# Patient Record
Sex: Male | Born: 1950 | Race: White | Hispanic: Yes | Marital: Married | Smoking: Current some day smoker
Health system: Southern US, Community
[De-identification: ages and names within clinical notes are randomized; demographics above are authoritative.]

## PROBLEM LIST (undated history)

## (undated) DIAGNOSIS — K219 Gastro-esophageal reflux disease without esophagitis: Secondary | ICD-10-CM

## (undated) DIAGNOSIS — I10 Essential (primary) hypertension: Secondary | ICD-10-CM

## (undated) DIAGNOSIS — H8110 Benign paroxysmal vertigo, unspecified ear: Secondary | ICD-10-CM

## (undated) DIAGNOSIS — E785 Hyperlipidemia, unspecified: Secondary | ICD-10-CM

## (undated) DIAGNOSIS — T7840XA Allergy, unspecified, initial encounter: Secondary | ICD-10-CM

## (undated) DIAGNOSIS — E119 Type 2 diabetes mellitus without complications: Secondary | ICD-10-CM

## (undated) HISTORY — PX: HERNIA REPAIR: SHX51

## (undated) HISTORY — DX: Allergy, unspecified, initial encounter: T78.40XA

## (undated) HISTORY — DX: Essential (primary) hypertension: I10

## (undated) HISTORY — PX: COLONOSCOPY: SHX174

## (undated) HISTORY — DX: Benign paroxysmal vertigo, unspecified ear: H81.10

## (undated) HISTORY — DX: Hyperlipidemia, unspecified: E78.5

## (undated) HISTORY — DX: Type 2 diabetes mellitus without complications: E11.9

## (undated) HISTORY — DX: Gastro-esophageal reflux disease without esophagitis: K21.9

---

## 2011-11-14 ENCOUNTER — Ambulatory Visit (HOSPITAL_BASED_OUTPATIENT_CLINIC_OR_DEPARTMENT_OTHER): Payer: Medicare Other

## 2011-11-29 ENCOUNTER — Ambulatory Visit (HOSPITAL_COMMUNITY)
Admission: RE | Admit: 2011-11-29 | Discharge: 2011-11-29 | Disposition: A | Discharge status: Home / Self Care | Payer: Medicare Other | Source: Ambulatory Visit | Attending: Internal Medicine | Admitting: Internal Medicine

## 2011-11-29 ENCOUNTER — Other Ambulatory Visit (HOSPITAL_COMMUNITY): Payer: Self-pay | Admitting: Internal Medicine

## 2011-11-29 DIAGNOSIS — R0602 Shortness of breath: Secondary | ICD-10-CM | POA: Insufficient documentation

## 2011-11-29 DIAGNOSIS — R079 Chest pain, unspecified: Secondary | ICD-10-CM | POA: Insufficient documentation

## 2012-10-24 DIAGNOSIS — K219 Gastro-esophageal reflux disease without esophagitis: Secondary | ICD-10-CM | POA: Insufficient documentation

## 2013-01-22 DIAGNOSIS — N529 Male erectile dysfunction, unspecified: Secondary | ICD-10-CM | POA: Insufficient documentation

## 2014-11-03 ENCOUNTER — Encounter (HOSPITAL_COMMUNITY): Payer: Self-pay | Admitting: *Deleted

## 2014-11-03 ENCOUNTER — Emergency Department (HOSPITAL_COMMUNITY): Payer: Medicare Other

## 2014-11-03 ENCOUNTER — Emergency Department (HOSPITAL_COMMUNITY)
Admission: EM | Admit: 2014-11-03 | Discharge: 2014-11-03 | Disposition: A | Discharge status: Home / Self Care | Payer: Medicare Other | Attending: Emergency Medicine | Admitting: Emergency Medicine

## 2014-11-03 DIAGNOSIS — J029 Acute pharyngitis, unspecified: Secondary | ICD-10-CM

## 2014-11-03 DIAGNOSIS — Z79899 Other long term (current) drug therapy: Secondary | ICD-10-CM | POA: Insufficient documentation

## 2014-11-03 DIAGNOSIS — Z791 Long term (current) use of non-steroidal anti-inflammatories (NSAID): Secondary | ICD-10-CM | POA: Diagnosis not present

## 2014-11-03 DIAGNOSIS — Z720 Tobacco use: Secondary | ICD-10-CM | POA: Diagnosis not present

## 2014-11-03 DIAGNOSIS — R6889 Other general symptoms and signs: Secondary | ICD-10-CM

## 2014-11-03 LAB — CBC WITH DIFFERENTIAL/PLATELET
BASOS ABS: 0 10*3/uL (ref 0.0–0.1)
BASOS PCT: 0 % (ref 0–1)
EOS ABS: 0 10*3/uL (ref 0.0–0.7)
Eosinophils Relative: 0 % (ref 0–5)
HEMATOCRIT: 40.4 % (ref 39.0–52.0)
Hemoglobin: 14.2 g/dL (ref 13.0–17.0)
Lymphocytes Relative: 11 % — ABNORMAL LOW (ref 12–46)
Lymphs Abs: 1.5 10*3/uL (ref 0.7–4.0)
MCH: 31.3 pg (ref 26.0–34.0)
MCHC: 35.1 g/dL (ref 30.0–36.0)
MCV: 89 fL (ref 78.0–100.0)
MONO ABS: 1.8 10*3/uL — AB (ref 0.1–1.0)
Monocytes Relative: 13 % — ABNORMAL HIGH (ref 3–12)
Neutro Abs: 10.4 10*3/uL — ABNORMAL HIGH (ref 1.7–7.7)
Neutrophils Relative %: 76 % (ref 43–77)
PLATELETS: 184 10*3/uL (ref 150–400)
RBC: 4.54 MIL/uL (ref 4.22–5.81)
RDW: 13.5 % (ref 11.5–15.5)
WBC: 13.7 10*3/uL — AB (ref 4.0–10.5)

## 2014-11-03 LAB — I-STAT CHEM 8, ED
BUN: 10 mg/dL (ref 6–23)
Calcium, Ion: 1.17 mmol/L (ref 1.13–1.30)
Chloride: 100 mmol/L (ref 96–112)
Creatinine, Ser: 0.7 mg/dL (ref 0.50–1.35)
GLUCOSE: 143 mg/dL — AB (ref 70–99)
HCT: 45 % (ref 39.0–52.0)
HEMOGLOBIN: 15.3 g/dL (ref 13.0–17.0)
POTASSIUM: 4 mmol/L (ref 3.5–5.1)
SODIUM: 136 mmol/L (ref 135–145)
TCO2: 23 mmol/L (ref 0–100)

## 2014-11-03 LAB — RAPID STREP SCREEN (MED CTR MEBANE ONLY): STREPTOCOCCUS, GROUP A SCREEN (DIRECT): NEGATIVE

## 2014-11-03 MED ORDER — ACETAMINOPHEN 500 MG PO TABS
1000.0000 mg | ORAL_TABLET | Freq: Once | ORAL | Status: AC
  Administered 2014-11-03: 1000 mg via ORAL
  Filled 2014-11-03: qty 2

## 2014-11-03 MED ORDER — ALBUTEROL SULFATE HFA 108 (90 BASE) MCG/ACT IN AERS: 2.0000 | INHALATION_SPRAY | RESPIRATORY_TRACT | Status: DC | PRN

## 2014-11-03 MED ORDER — ALBUTEROL SULFATE HFA 108 (90 BASE) MCG/ACT IN AERS
2.0000 | INHALATION_SPRAY | Freq: Once | RESPIRATORY_TRACT | Status: AC
  Administered 2014-11-03: 2 via RESPIRATORY_TRACT
  Filled 2014-11-03: qty 6.7

## 2014-11-03 MED ORDER — HYDROCODONE-HOMATROPINE 5-1.5 MG/5ML PO SYRP: 5.0000 mL | ORAL_SOLUTION | Freq: Four times a day (QID) | ORAL | Status: DC | PRN

## 2014-11-03 MED ORDER — IBUPROFEN 600 MG PO TABS: 600.0000 mg | ORAL_TABLET | Freq: Four times a day (QID) | ORAL | Status: DC | PRN

## 2014-11-03 NOTE — ED Provider Notes (Signed)
CSN: [id number]     Arrival date & time 11/03/14  8295 History   First MD Initiated Contact with Patient 11/03/14 [phone]     Chief Complaint  Patient presents with  . Sore Throat  . Generalized Body Aches     (Consider location/radiation/quality/duration/timing/severity/associated sxs/prior Treatment) HPI Patrick Park is a 64 y.o. male presents to emergency department complaining of sore throat, generalized body aches, cough for the last 3 days. Patient states he has had subjective fever and chills. He denies any nasal congestion. He has been taking naproxen, last dose taken yesterday. He denies any chest pain or shortness of breath. Denies any urinary symptoms. No nausea, vomiting, diarrhea. No recent sick contacts. States all his vaccines including flu vaccination is up-to-date. Has not tried any other treatment other than naproxen. Denies difficulty swallowing, change in voice, neck pain or stiffness. Nothing making his symptoms better or worse.  History reviewed. No pertinent past medical history. Past Surgical History  Procedure Laterality Date  . Hernia repair     No family history on file. History  Substance Use Topics  . Smoking status: Current Some Day Smoker  . Smokeless tobacco: Not on file  . Alcohol Use: No    Review of Systems  Constitutional: Positive for fever and chills.  HENT: Positive for sore throat. Negative for congestion, trouble swallowing and voice change.   Respiratory: Positive for cough. Negative for chest tightness and shortness of breath.   Cardiovascular: Negative for chest pain, palpitations and leg swelling.  Gastrointestinal: Negative for nausea, vomiting, abdominal pain, diarrhea and abdominal distention.  Genitourinary: Negative for dysuria, urgency, frequency and hematuria.  Musculoskeletal: Negative for myalgias, arthralgias, neck pain and neck stiffness.  Skin: Negative for rash.  Allergic/Immunologic: Negative for immunocompromised state.   Neurological: Negative for dizziness, weakness, light-headedness, numbness and headaches.  All other systems reviewed and are negative.     Allergies  Review of patient's allergies indicates not on file.  Home Medications   Prior to Admission medications   Medication Sig Start Date End Date Taking? Authorizing Provider  Fish Oil OIL Take 1 capsule by mouth daily.   Yes Historical Provider, MD  ibuprofen (ADVIL,MOTRIN) 200 MG tablet Take 800 mg by mouth every 6 (six) hours as needed for moderate pain.   Yes Historical Provider, MD  metFORMIN (GLUCOPHAGE) 500 MG tablet Take 500 mg by mouth daily.   Yes Historical Provider, MD  Multiple Vitamin (MULTIVITAMIN) tablet Take 1 tablet by mouth daily.   Yes Historical Provider, MD  naproxen (NAPROSYN) 500 MG tablet Take 500 mg by mouth daily.   Yes Historical Provider, MD  omeprazole (PRILOSEC) 20 MG capsule Take 20 mg by mouth daily.   Yes Historical Provider, MD  pravastatin (PRAVACHOL) 20 MG tablet Take 20 mg by mouth daily.   Yes Historical Provider, MD   BP 148/72 mmHg  Pulse 94  Temp(Src) 99.4 F (37.4 C) (Oral)  Resp 16  Ht  (1.6 m)  Wt 174 lb (78.926 kg)  BMI 30.83 kg/m2  SpO2 97% Physical Exam  Constitutional: He is oriented to person, place, and time. He appears well-developed and well-nourished. No distress.  HENT:  Head: Normocephalic and atraumatic.  Right Ear: External ear normal.  Left Ear: External ear normal.  Nose: Nose normal.  Mouth/Throat: Uvula is midline and mucous membranes are normal. Oropharyngeal exudate and posterior oropharyngeal erythema present. No tonsillar abscesses.  Bilaterally enlarged tonsils with erythema and exudate  Eyes: Conjunctivae and EOM are  normal. Pupils are equal, round, and reactive to light.  Neck: Normal range of motion. Neck supple.  Cardiovascular: Normal rate, regular rhythm and normal heart sounds.   Pulmonary/Chest: Effort normal. No respiratory distress. He has no  wheezes. He has no rales.  Abdominal: Soft. Bowel sounds are normal. He exhibits no distension. There is no tenderness. There is no rebound.  Musculoskeletal: He exhibits no edema.  Lymphadenopathy:    He has no cervical adenopathy.  Neurological: He is alert and oriented to person, place, and time.  Skin: Skin is warm and dry.  Nursing note and vitals reviewed.   ED Course  Procedures (including critical care time) Labs Review Labs Reviewed  CBC WITH DIFFERENTIAL/PLATELET - Abnormal; Notable for the following:    WBC 13.7 (*)    Neutro Abs 10.4 (*)    Lymphocytes Relative 11 (*)    Monocytes Relative 13 (*)    Monocytes Absolute 1.8 (*)    All other components within normal limits  I-STAT CHEM 8, ED - Abnormal; Notable for the following:    Glucose, Bld 143 (*)    All other components within normal limits  RAPID STREP SCREEN  CULTURE, GROUP A STREP    Imaging Review Dg Chest 2 View  11/03/2014   CLINICAL DATA:  Cough, fever for 3 days.  EXAM: CHEST  2 VIEW  COMPARISON:  None.  FINDINGS: The heart size and mediastinal contours are within normal limits. Both lungs are clear. The visualized skeletal structures are unremarkable.  IMPRESSION: No active cardiopulmonary disease.   Electronically Signed   By: Charlett NoseKevin  Dover M.D.   On: 11/03/2014 11:05     EKG Interpretation None     Chem 8: Na 136, K 4.0, Cl 100, Ca 1.17, Tco2  23, Glu 143, BUN 10, Creat 0.7, Hct 45, Hgb 15.3  MDM   Final diagnoses:  Pharyngitis  Flu-like symptoms    Patient with a URI and flulike symptoms. He is febrile at 101.3 here. Tylenol given for his fever. Inhaler for cough. Chest x-ray unremarkable. Chem 8 unremarkable other than glucose 143. WBC elevated at 13.7. Strep negative, cultures sent. Exam and symptoms are consistent with flulike illness. His temperature improved with Tylenol. He is a well-appearing, nontoxic. Blood pressure, heart rate, oxygen saturation within normal. Stable for discharge  home with symptomatic treatment, inhaler, will give Hycodan for pain and for cough. Follow up with primary care doctor.  Filed Vitals:   11/03/14 1300 11/03/14 1315 11/03/14 1330 11/03/14 1337  BP: 130/78 124/66 130/83   Pulse: 87 94 93   Temp:    98.8 F (37.1 C)  TempSrc:    Oral  Resp:      Height:      Weight:      SpO2: 96% 97% 98%       Jaynie Crumbleatyana Leialoha Hanna, PA-C 11/03/14 1507  Doug SouSam Jacubowitz, MD 11/03/14 1722

## 2014-11-03 NOTE — ED Notes (Signed)
Pt presents via POV c/o sore throat and generalized body aches since Sunday.  Denies N/V/D.  Unsure if he has had a fever.  Pt a x 4.  NAD.

## 2014-11-03 NOTE — ED Notes (Signed)
Orlean Bradfordatyana Kirchenko PA given reults of Chem8.ED-Lab

## 2014-11-03 NOTE — Discharge Instructions (Signed)
Take ibuprofen for pain and fever as prescribed. Hycodan for cough and sore throat. Albuterol 2 puffs every 4 hrs for cough. I think you have a viral illness and should improve with rest and fluids. If you are feeling worse or not improving please follow up with your doctor or return to the ER>   Influenza Influenza ("the flu") is a viral infection of the respiratory tract. It occurs more often in winter months because people spend more time in close contact with one another. Influenza can make you feel very sick. Influenza easily spreads from person to person (contagious). CAUSES  Influenza is caused by a virus that infects the respiratory tract. You can catch the virus by breathing in droplets from an infected person's cough or sneeze. You can also catch the virus by touching something that was recently contaminated with the virus and then touching your mouth, nose, or eyes. RISKS AND COMPLICATIONS You may be at risk for a more severe case of influenza if you smoke cigarettes, have diabetes, have chronic heart disease (such as heart failure) or lung disease (such as asthma), or if you have a weakened immune system. Elderly people and pregnant women are also at risk for more serious infections. The most common problem of influenza is a lung infection (pneumonia). Sometimes, this problem can require emergency medical care and may be life threatening. SIGNS AND SYMPTOMS  Symptoms typically last 4 to 10 days and may include:  Fever.  Chills.  Headache, body aches, and muscle aches.  Sore throat.  Chest discomfort and cough.  Poor appetite.  Weakness or feeling tired.  Dizziness.  Nausea or vomiting. DIAGNOSIS  Diagnosis of influenza is often made based on your history and a physical exam. A nose or throat swab test can be done to confirm the diagnosis. TREATMENT  In mild cases, influenza goes away on its own. Treatment is directed at relieving symptoms. For more severe cases, your health  care provider may prescribe antiviral medicines to shorten the sickness. Antibiotic medicines are not effective because the infection is caused by a virus, not by bacteria. HOME CARE INSTRUCTIONS  Take medicines only as directed by your health care provider.  Use a cool mist humidifier to make breathing easier.  Get plenty of rest until your temperature returns to normal. This usually takes 3 to 4 days.  Drink enough fluid to keep your urine clear or pale yellow.  Cover yourmouth and nosewhen coughing or sneezing,and wash your handswellto prevent thevirusfrom spreading.  Stay homefromwork orschool untilthe fever is gonefor at least 28full day. PREVENTION  An annual influenza vaccination (flu shot) is the best way to avoid getting influenza. An annual flu shot is now routinely recommended for all adults in the [place] SEEK MEDICAL CARE IF:  You experiencechest pain, yourcough worsens,or you producemore mucus.  Youhave nausea,vomiting, ordiarrhea.  Your fever returns or gets worse. SEEK IMMEDIATE MEDICAL CARE IF:  You havetrouble breathing, you become short of breath,or your skin ornails becomebluish.  You have severe painor stiffnessin the neck.  You develop a sudden headache, or pain in the face or ear.  You have nausea or vomiting that you cannot control. MAKE SURE YOU:   Understand these instructions.  Will watch your condition.  Will get help right away if you are not doing well or get worse. Document Released: 08/11/2000 Document Revised: 12/29/2013 Document Reviewed: 11/13/2011 [facility] Patient Information 2015 [place], [place]. This information is not intended to replace advice given to you by your  health care provider. Make sure you discuss any questions you have with your health care provider.   Gripe (Influenza) La gripe es una infeccin viral del tracto respiratorio. Ocurre con ms frecuencia en los meses de invierno, ya que las personas  pasan ms tiempo en contacto cercano. La gripe puede enfermarlo considerablemente. Se transmite fcilmente de [place] persona a otra (es contagiosa). CAUSAS  La causa es un virus que infecta el tracto respiratorio. Puede contagiarse el virus al aspirar las gotitas que una persona infectada elimina al toser o Engineering geologistestornudar. Tambin puede contagiarse al tocar algo que fue recientemente contaminado con el virus y Tenet Healthcareluego llevarse la mano a la boca, la nariz o los ojos. RIESGOS Y COMPLICACIONES Tendr mayor riesgo de sufrir un resfro grave si consume cigarrillos, es diabtico, sufre una enfermedad cardaca (como insuficiencia cardaca) o pulmonar crnica (como asma) o si tiene debilitado el sistema inmunolgico. Los ancianos y las mujeres embarazadas tienen ms riesgo de sufrir infecciones graves. El problema ms frecuente de la gripe es la infeccin pulmonar (neumona). En algunos casos, este problema puede requerir atencin mdica de emergencia y Biochemist, clinicalponer en peligro la vida. Marnee SpringSIGNOS Y SNTOMAS  Los sntomas pueden durar entre 4 y [address] das y pueden ser:  Grant RutsFiebre.  Escalofros.  Dolor de Turkmenistancabeza, dolores en el cuerpo y musculares.  Dolor de Advertising copywritergarganta.  Molestias en el pecho y tos.  Prdida del apetito.  Debilidad o cansancio.  Mareos.  Nuseas o vmitos. DIAGNSTICO  El diagnstico se realiza segn su historia clnica y un examen fsico. Es necesario realizar un anlisis de cultivo farngeo o nasal para confirmar el diagnstico. TRATAMIENTO  En los casos leves, la gripe se cura sin TEFL teachertratamiento. El tratamiento est dirigido a Consulting civil engineeraliviar los sntomas. En los casos ms graves, el mdico podr recetar medicamentos antivirales para acortar el curso de la enfermedad. Los antibiticos no son eficaces, ya que la infeccin est causada por un virus y no una bacteria. INSTRUCCIONES PARA EL CUIDADO EN EL HOGAR  Tome los medicamentos solamente como se lo haya indicado el mdico.  Utilice un humidificador de niebla  fra para facilitar la respiracin.  Haga reposo hasta que la temperatura vuelva a ser normal. Generalmente esto lleva entre 3 y [address] das.  Beba suficiente lquido para Photographermantener la orina clara o de color amarillo plido.  Cbrase la boca y la nariz al toser o Engineering geologistestornudar, y Allstatelvese las manos muy bien para evitar que se propague el virus.  Lanny HurstQudese en su casa y no concurra al Aleen Campitrabajo o a la escuela hasta que la fiebre haya desaparecido al menos por un da completo. PREVENCIN  La vacunacin anual contra la gripe es la mejor manera de evitar enfermarse. Se recomienda ahora de manera rutinaria una vacuna anual contra la gripe a todos los Lowe's Companiesadultos estadounidenses. SOLICITE ATENCIN MDICA SI:  Tiene dolor en el pecho, la tos empeora o tiene ms mucosidad.  Tiene nuseas, vmitos o diarrea.  La fiebre regresa o empeora. SOLICITE ATENCIN MDICA DE INMEDIATO SI:   Tiene dificultad para respirar, le falta el aire o tiene la piel o las uas [place] Laurinburgazuladas.  Presenta dolor intenso o entumecimiento en el cuello.  Le duele la cabeza de forma repentina o tiene dolor en la cara o el odo.  Tiene nuseas o vmitos que no puede controlar. ASEGRESE DE QUE:   Comprende estas instrucciones.  Controlar su afeccin.  Recibir ayuda de inmediato si no mejora o si empeora. Document Released: 05/24/2005 Document Revised: 12/29/2013 [employer] Patient  Information 2015 [place], [place]. This information is not intended to replace advice given to you by your health care provider. Make sure you discuss any questions you have with your health care provider.

## 2014-11-05 LAB — CULTURE, GROUP A STREP: Strep A Culture: NEGATIVE

## 2016-09-10 IMAGING — CR DG CHEST 2V
2 series · 2 of 2 positions shown · non-contrast
Comparison: None.

CLINICAL DATA: Cough, fever for 3 days.

EXAM:
CHEST  2 VIEW

[w chest pa]
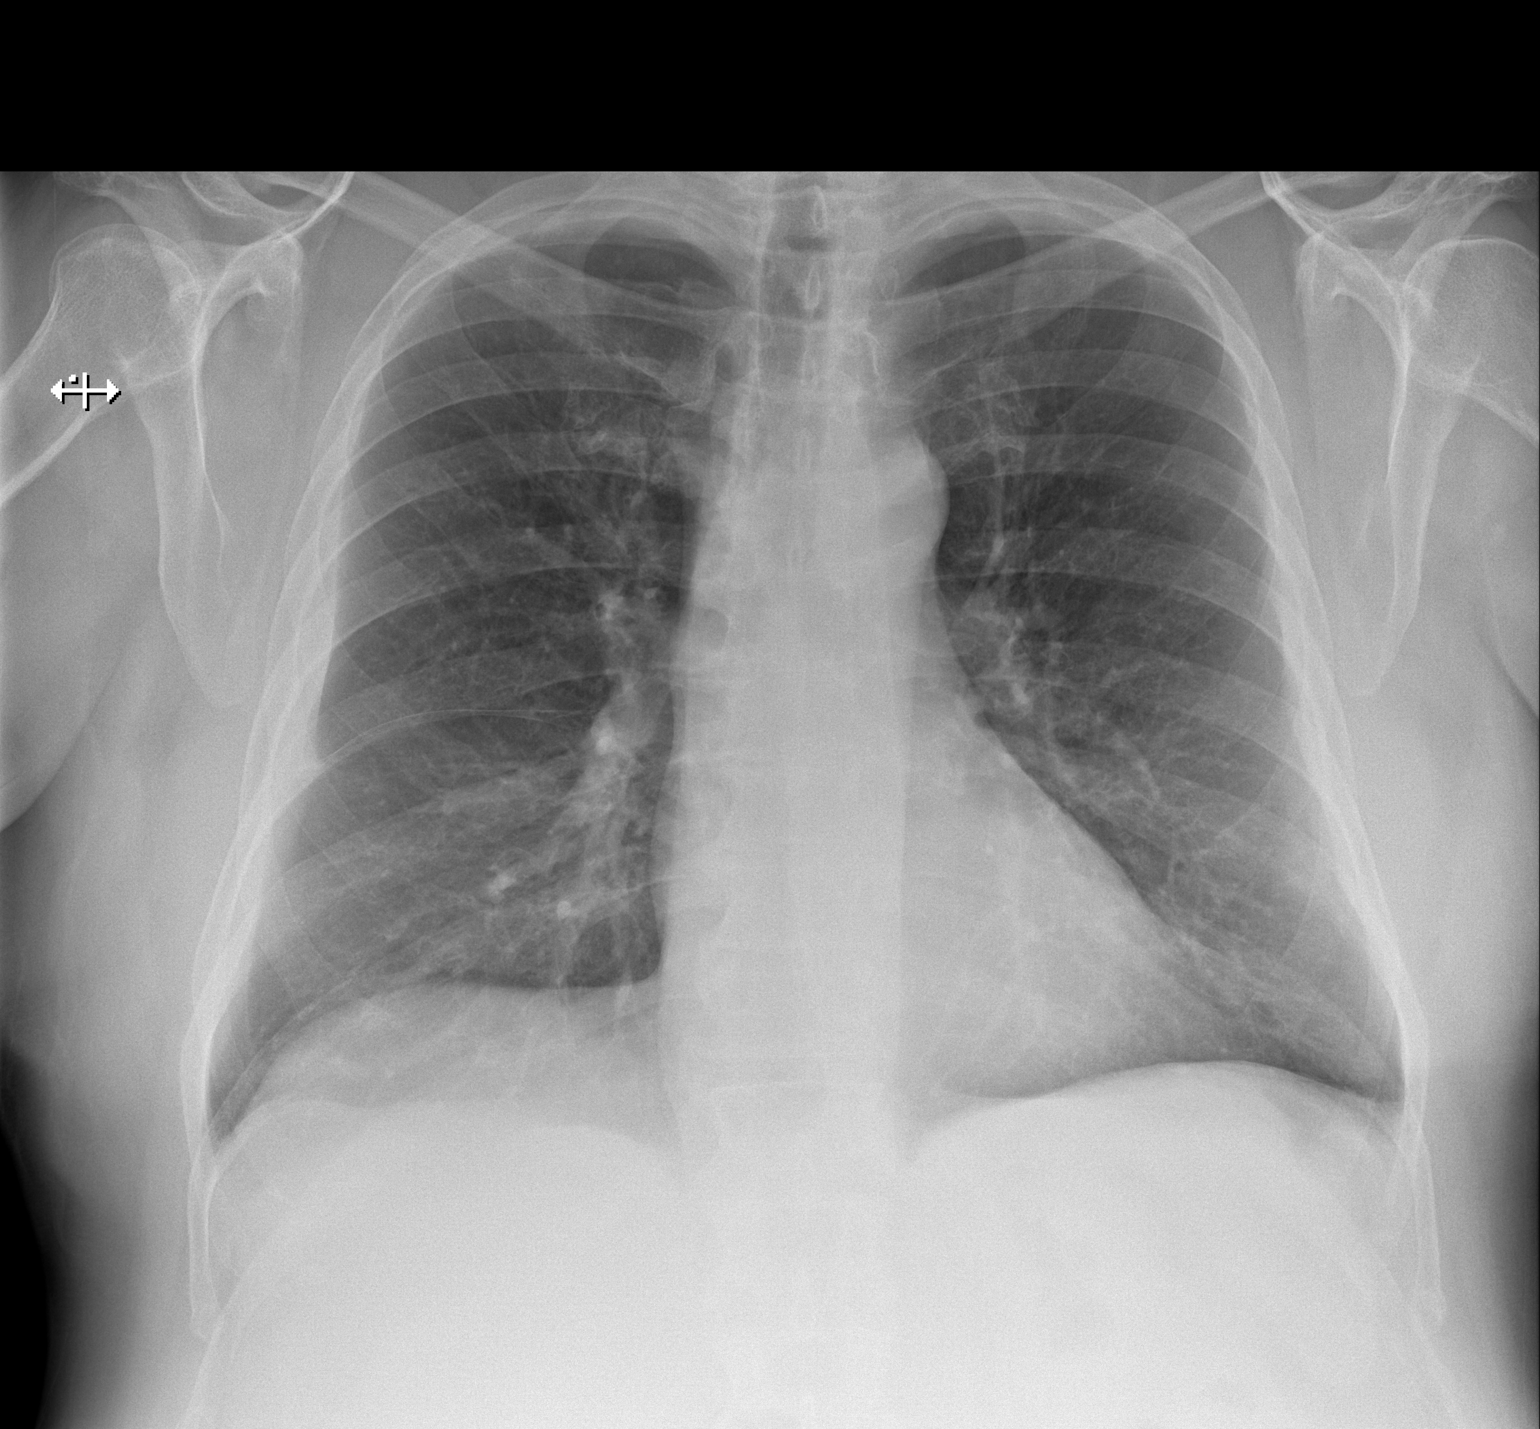

[w chest lat]
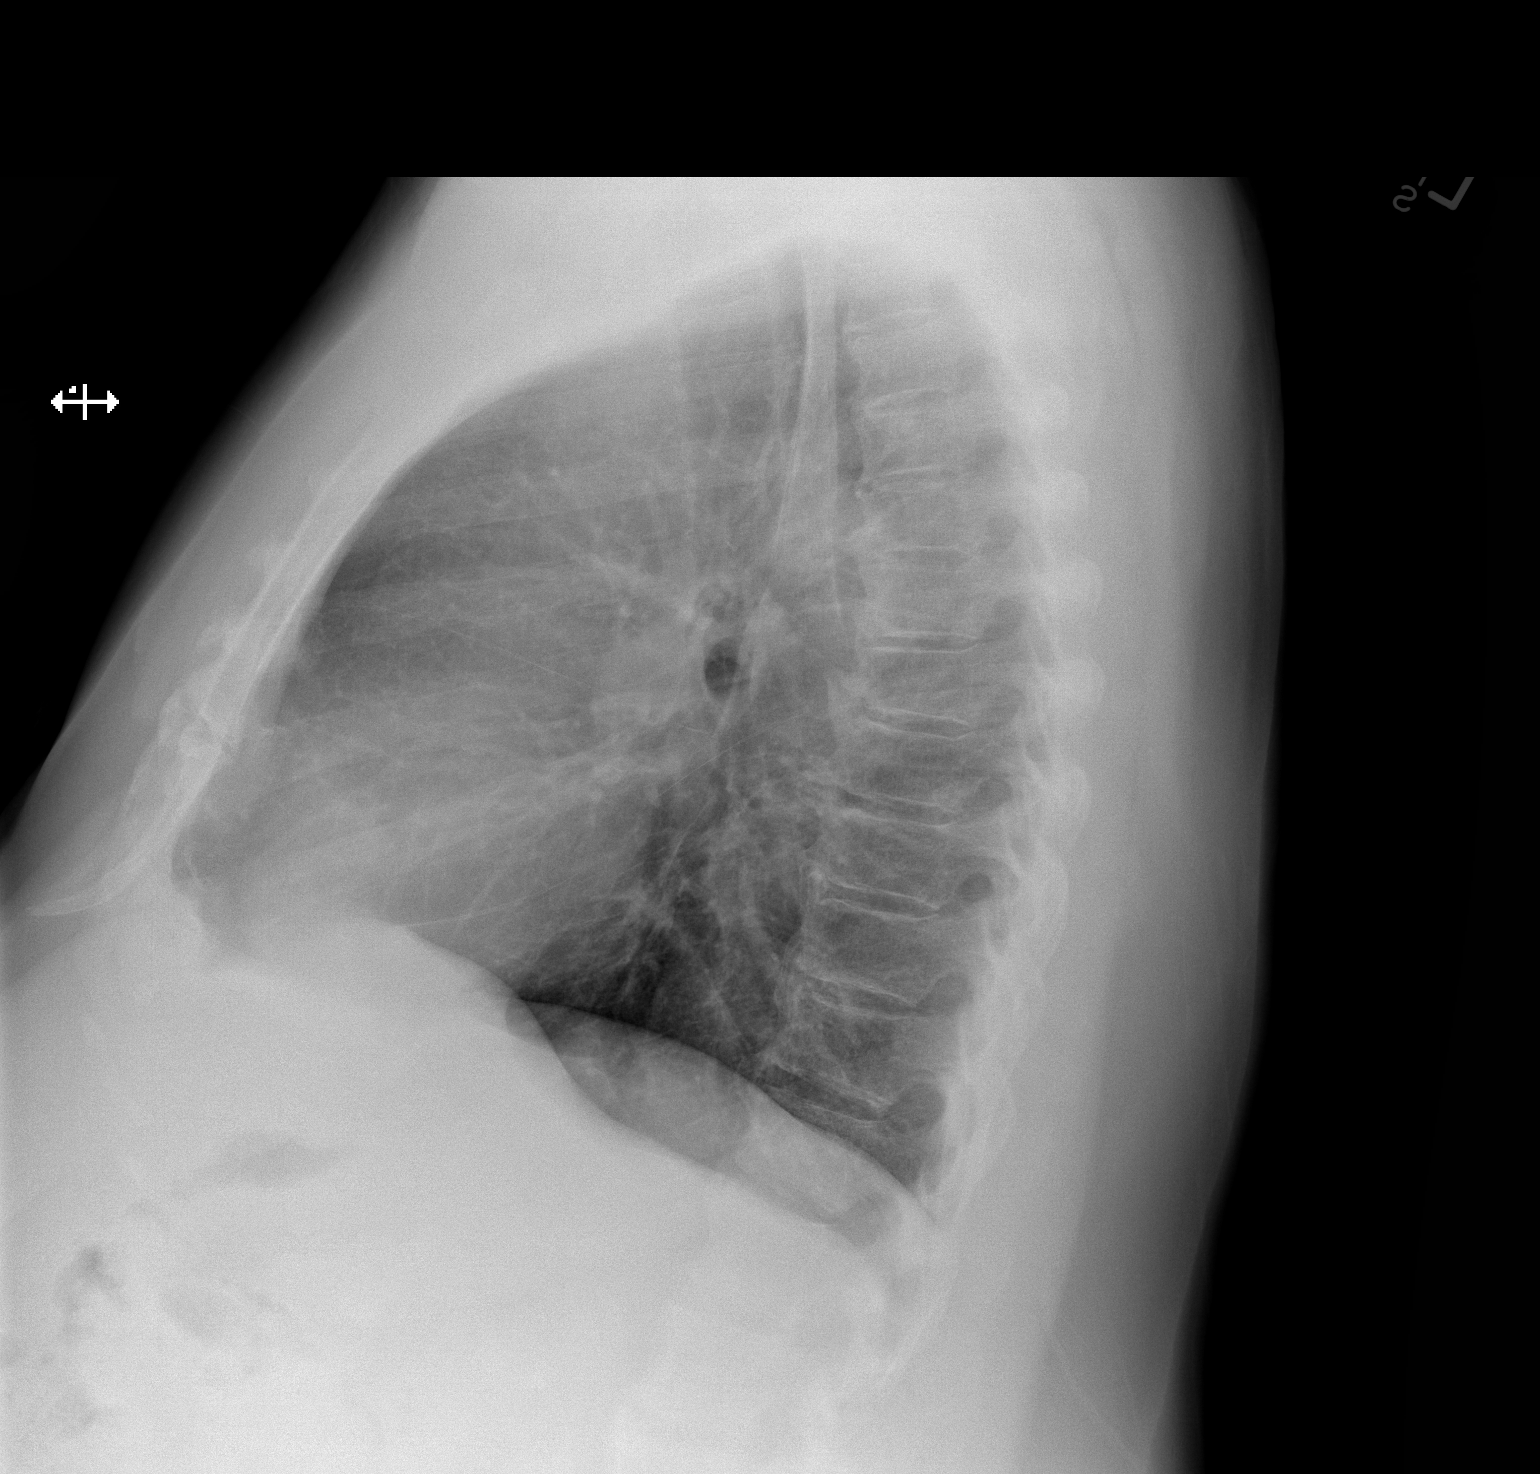

[2 of 2 positions shown; findings below may reference images not displayed]

FINDINGS: The heart size and mediastinal contours are within normal limits.
Both lungs are clear. The visualized skeletal structures are
unremarkable.
IMPRESSION: No active cardiopulmonary disease.

## 2017-03-05 ENCOUNTER — Encounter: Payer: Self-pay | Admitting: Gastroenterology

## 2017-05-01 ENCOUNTER — Encounter: Payer: Medicare Other | Admitting: Gastroenterology

## 2017-05-17 DIAGNOSIS — I10 Essential (primary) hypertension: Secondary | ICD-10-CM | POA: Diagnosis not present

## 2017-05-17 DIAGNOSIS — E785 Hyperlipidemia, unspecified: Secondary | ICD-10-CM | POA: Diagnosis not present

## 2017-05-17 DIAGNOSIS — E119 Type 2 diabetes mellitus without complications: Secondary | ICD-10-CM | POA: Diagnosis not present

## 2017-06-20 ENCOUNTER — Encounter: Payer: Self-pay | Admitting: Medical

## 2017-11-21 ENCOUNTER — Encounter: Payer: Self-pay | Admitting: Gastroenterology

## 2018-02-04 ENCOUNTER — Encounter: Payer: Self-pay | Admitting: Gastroenterology

## 2018-03-18 ENCOUNTER — Ambulatory Visit (AMBULATORY_SURGERY_CENTER): Payer: Self-pay | Admitting: *Deleted

## 2018-03-18 ENCOUNTER — Other Ambulatory Visit: Payer: Self-pay

## 2018-03-18 ENCOUNTER — Encounter (INDEPENDENT_AMBULATORY_CARE_PROVIDER_SITE_OTHER): Payer: Self-pay

## 2018-03-18 VITALS — Ht 63.0 in | Wt 188.6 lb

## 2018-03-18 DIAGNOSIS — Z1211 Encounter for screening for malignant neoplasm of colon: Secondary | ICD-10-CM

## 2018-03-18 MED ORDER — PEG 3350-KCL-NA BICARB-NACL 420 G PO SOLR: 4000.0000 mL | Freq: Once | ORAL | 0 refills | Status: AC

## 2018-03-18 NOTE — Progress Notes (Signed)
No egg or soy allergy known to patient  No issues with past sedation with any surgeries  or procedures, no intubation problems  No diet pills per patient No home 02 use per patient  No blood thinners per patient  Pt denies issues with constipation  No A fib or A flutter  EMMI video sent to pt's e mail  Pt explained in PV the last 3-4 months he has experienced some dizziness in the mornings- asked pt to call his PCP and discuss with them or make an OV with the PCP - he verbalized understanding

## 2018-03-21 ENCOUNTER — Encounter: Payer: Self-pay | Admitting: Gastroenterology

## 2018-04-02 ENCOUNTER — Encounter: Payer: Self-pay | Admitting: Gastroenterology

## 2018-04-02 ENCOUNTER — Ambulatory Visit (AMBULATORY_SURGERY_CENTER): Payer: Medicare Other | Admitting: Gastroenterology

## 2018-04-02 VITALS — BP 125/78 | HR 75 | Temp 98.6°F | Resp 16 | Ht 63.0 in | Wt 188.0 lb

## 2018-04-02 DIAGNOSIS — Z1212 Encounter for screening for malignant neoplasm of rectum: Secondary | ICD-10-CM | POA: Diagnosis not present

## 2018-04-02 DIAGNOSIS — Z1211 Encounter for screening for malignant neoplasm of colon: Secondary | ICD-10-CM

## 2018-04-02 MED ORDER — SODIUM CHLORIDE 0.9 % IV SOLN: 500.0000 mL | Freq: Once | INTRAVENOUS | Status: DC

## 2018-04-02 NOTE — Op Note (Signed)
[facility] Patient Name: Patrick Park Procedure Date: 04/02/2018 9:18 AM MRN: [id number] Endoscopist: Rachael Feeaniel P Jacobs , MD Age: 67 Referring MD:  Date of Birth: November 11, 1950 Gender: Male Account #: [id number] Procedure:                Colonoscopy Indications:              Screening for colorectal malignant neoplasm Medicines:                Monitored Anesthesia Care Procedure:                Pre-Anesthesia Assessment:                           - Prior to the procedure, a History and Physical                            was performed, and patient medications and                            allergies were reviewed. The patient's tolerance of                            previous anesthesia was also reviewed. The risks                            and benefits of the procedure and the sedation                            options and risks were discussed with the patient.                            All questions were answered, and informed consent                            was obtained. Prior Anticoagulants: The patient has                            taken no previous anticoagulant or antiplatelet                            agents. ASA Grade Assessment: II - A patient with                            mild systemic disease. After reviewing the risks                            and benefits, the patient was deemed in                            satisfactory condition to undergo the procedure.                           After obtaining informed consent, the colonoscope  was passed under direct vision. Throughout the                            procedure, the patient's blood pressure, pulse, and                            oxygen saturations were monitored continuously. The                            Colonoscope was introduced through the anus with                            the intention of advancing to the cecum. The scope                            was advanced to  the sigmoid colon before the                            procedure was aborted. Medications were given. The                            colonoscopy was performed without difficulty. The                            patient tolerated the procedure well. The quality                            of the bowel preparation was poor. No anatomical                            landmarks were photographed. Scope In: 9:21:31 AM Scope Out: 9:23:40 AM Total Procedure Duration: 0 hours 2 minutes 9 seconds  Findings:                 A large amount of solid stool was found in the                            recto-sigmoid colon, precluding visualization.                           This was an INCOMPLETE examination. Complications:            No immediate complications. Estimated blood loss:                            None. Estimated Blood Loss:     Estimated blood loss: none. Impression:               - Very poor prep, this was an INCOMPLETE                            examination. Recommendation:           - Patient has a contact number available for  emergencies. The signs and symptoms of potential                            delayed complications were discussed with the                            patient. Return to normal activities tomorrow.                            Written discharge instructions were provided to the                            patient.                           - Resume previous diet.                           - Continue present medications.                           - Repeat colonoscopy at the next available                            appointment because the bowel preparation was poor.                            Double Prep Protocol. Rachael Fee, MD 04/02/2018 9:28:28 AM This report has been signed electronically.

## 2018-04-02 NOTE — Progress Notes (Signed)
Report given to PACU, vss 

## 2018-04-02 NOTE — Patient Instructions (Signed)
Discharge instructions given. Rescheduled Colonoscopy pre Dr. Christella HartiganJacobs. Resume previous medications. YOU HAD AN ENDOSCOPIC PROCEDURE TODAY AT THE [facility]:   Refer to the procedure report that was given to you for any specific questions about what was found during the examination.  If the procedure report does not answer your questions, please call your gastroenterologist to clarify.  If you requested that your care partner not be given the details of your procedure findings, then the procedure report has been included in a sealed envelope for you to review at your convenience later.  YOU SHOULD EXPECT: Some feelings of bloating in the abdomen. Passage of more gas than usual.  Walking can help get rid of the air that was put into your GI tract during the procedure and reduce the bloating. If you had a lower endoscopy (such as a colonoscopy or flexible sigmoidoscopy) you may notice spotting of blood in your stool or on the toilet paper. If you underwent a bowel prep for your procedure, you may not have a normal bowel movement for a few days.  Please Note:  You might notice some irritation and congestion in your nose or some drainage.  This is from the oxygen used during your procedure.  There is no need for concern and it should clear up in a day or so.  SYMPTOMS TO REPORT IMMEDIATELY:   Following lower endoscopy (colonoscopy or flexible sigmoidoscopy):  Excessive amounts of blood in the stool  Significant tenderness or worsening of abdominal pains  Swelling of the abdomen that is new, acute  Fever of 100F or higher   For urgent or emergent issues, a gastroenterologist can be reached at any hour by calling [phone].   DIET:  We do recommend a small meal at first, but then you may proceed to your regular diet.  Drink plenty of fluids but you should avoid alcoholic beverages for 24 hours.  ACTIVITY:  You should plan to take it easy for the rest of today and you should NOT  DRIVE or use heavy machinery until tomorrow (because of the sedation medicines used during the test).    FOLLOW UP: Our staff will call the number listed on your records the next business day following your procedure to check on you and address any questions or concerns that you may have regarding the information given to you following your procedure. If we do not reach you, we will leave a message.  However, if you are feeling well and you are not experiencing any problems, there is no need to return our call.  We will assume that you have returned to your regular daily activities without incident.  If any biopsies were taken you will be contacted by phone or by letter within the next 1-3 weeks.  Please call us at [phone] if you have not heard about the biopsies in 3 weeks.    SIGNATURES/CONFIDENTIALITY: You and/or your care partner have signed paperwork which will be entered into your electronic medical record.  These signatures attest to the fact that that the information above on your After Visit Summary has been reviewed and is understood.  Full responsibility of the confidentiality of this discharge information lies with you and/or your care-partner.

## 2018-04-02 NOTE — Progress Notes (Signed)
Pt's states no medical or surgical changes since previsit or office visit. 

## 2018-04-03 ENCOUNTER — Telehealth: Payer: Self-pay

## 2018-04-03 NOTE — Telephone Encounter (Signed)
  Follow up Call-  Call back number 04/02/2018  Post procedure Call Back phone  # [phone]  Permission to leave phone message No  Some recent data might be hidden     Patient questions:  Do you have a fever, pain , or abdominal swelling? No. Pain Score  0 *  Have you tolerated food without any problems? Yes.    Have you been able to return to your normal activities? Yes.    Do you have any questions about your discharge instructions: Diet   No. Medications  No. Follow up visit  No.  Do you have questions or concerns about your Care? No.  Actions: * If pain score is 4 or above: No action needed, pain <4.

## 2018-04-23 ENCOUNTER — Encounter: Payer: Medicare Other | Admitting: Gastroenterology

## 2018-06-06 ENCOUNTER — Ambulatory Visit (INDEPENDENT_AMBULATORY_CARE_PROVIDER_SITE_OTHER): Payer: Medicare Other | Admitting: Emergency Medicine

## 2018-06-06 ENCOUNTER — Encounter: Payer: Self-pay | Admitting: Emergency Medicine

## 2018-06-06 VITALS — BP 133/73 | HR 79 | Temp 98.3°F | Resp 16 | Ht 62.25 in | Wt 186.8 lb

## 2018-06-06 DIAGNOSIS — R5383 Other fatigue: Secondary | ICD-10-CM

## 2018-06-06 DIAGNOSIS — I10 Essential (primary) hypertension: Secondary | ICD-10-CM | POA: Diagnosis not present

## 2018-06-06 DIAGNOSIS — E119 Type 2 diabetes mellitus without complications: Secondary | ICD-10-CM

## 2018-06-06 DIAGNOSIS — E785 Hyperlipidemia, unspecified: Secondary | ICD-10-CM | POA: Diagnosis not present

## 2018-06-06 DIAGNOSIS — R531 Weakness: Secondary | ICD-10-CM | POA: Diagnosis not present

## 2018-06-06 DIAGNOSIS — R0683 Snoring: Secondary | ICD-10-CM

## 2018-06-06 DIAGNOSIS — Z23 Encounter for immunization: Secondary | ICD-10-CM | POA: Diagnosis not present

## 2018-06-06 MED ORDER — LISINOPRIL 10 MG PO TABS: 10.0000 mg | ORAL_TABLET | Freq: Every day | ORAL | 3 refills | Status: DC

## 2018-06-06 MED ORDER — METFORMIN HCL ER 500 MG PO TB24: 500.0000 mg | ORAL_TABLET | Freq: Every day | ORAL | 3 refills | Status: DC

## 2018-06-06 NOTE — Patient Instructions (Addendum)
     If you have lab work done today you will be contacted with your lab results within the next 2 weeks.  If you have not heard from us then please contact us. The fastest way to get your results is to register for My Chart.   IF you received an x-ray today, you will receive an invoice from [facility]. Please contact [facility] at [phone] with questions or concerns regarding your invoice.   IF you received labwork today, you will receive an invoice from [facility]. Please contact [facility] at [phone] with questions or concerns regarding your invoice.   Our billing staff will not be able to assist you with questions regarding bills from these companies.  You will be contacted with the lab results as soon as they are available. The fastest way to get your results is to activate your My Chart account. Instructions are located on the last page of this paperwork. If you have not heard from us regarding the results in 2 weeks, please contact this office.     Debilidad (Weakness) La debilidad es la falta de energa. Puede sentir debilidad en todo el cuerpo (generalizada) o puede sentir debilidad en una parte especfica del cuerpo (focal). Hay muchas causas posibles de debilidad. A veces, la causa de la debilidad puede ser desconocida. Algunas causas de debilidad pueden ser graves, por lo que es importante que consulte a un mdico. CUIDADOS EN EL HOGAR  Descanse todo lo que sea necesario.  Trate de dormir lo suficiente. Hable con el mdico acerca de cunto debe dormir cada noche.  Tome los medicamentos de venta libre y los recetados solamente como se lo haya indicado el mdico.  Consuma una dieta sana y bien equilibrada. Esto incluye lo siguiente: ? Protenas para fortalecer los msculos, como carne magra y pescado. ? Frutas y verduras frescas. ? Carbohidratos para tener ms energa, como cereales integrales.  Beba suficiente lquido para mantener el pis (orina)  claro o de color amarillo plido.  Haga ejercicios de fuerza, como flexiones de brazos y elevaciones de las piernas, durante 30minutos al menos dos veces por semana o como se lo haya indicado el mdico.  Piense en consultar a un fisioterapeuta o entrenador para que lo ayude a estar ms fuerte.  Concurra a todas las visitas de control como se lo haya indicado el mdico. Esto es importante. SOLICITE AYUDA SI:  La debilidad no mejora o empeora.  La debilidad afecta su capacidad para hacer lo siguiente: ? Pensar con claridad. ? Realizar sus actividades habituales. SOLICITE AYUDA DE INMEDIATO SI:  Siente debilidad repentina.  Tiene dificultad para respirar o le falta el aire.  Tiene problemas visuales.  Tiene dificultad para hablar o tragar.  Tiene dificultad para pararse o caminar.  Siente dolor en el pecho.  Se siente mareado.  Se desmaya (pierde el conocimiento). Esta informacin no tiene como fin reemplazar el consejo del mdico. Asegrese de hacerle al mdico cualquier pregunta que tenga. Document Released: 11/10/2008 Document Revised: 02/13/2012 Document Reviewed: 06/04/2015 Elsevier Interactive Patient Education  2018 Elsevier Inc.  

## 2018-06-06 NOTE — Addendum Note (Signed)
Addended by: Georg Ruddle A on: 06/06/2018 03:46 PM   Modules accepted: Orders

## 2018-06-06 NOTE — Progress Notes (Signed)
Patrick Park 67 y.o.   Chief Complaint  Patient presents with  . Generalized Body Aches    x 3 weeks with nasal congetion  . Establish Care    and check cholesterol    HISTORY OF PRESENT ILLNESS: This is a 67 y.o. male with history of diabetes hypertension and high cholesterol complaining of feeling weak and tired for the past 8 months.  Also complaining of generalized achiness.  Denies any other significant symptoms. Snores and occasionally has daytime sleepiness.  HPI   Prior to Admission medications   Medication Sig Start Date End Date Taking? Authorizing Provider  albuterol (PROVENTIL HFA;VENTOLIN HFA) 108 (90 BASE) MCG/ACT inhaler Inhale 2 puffs into the lungs every 4 (four) hours as needed for wheezing or shortness of breath. 11/03/14  Yes Kirichenko, Tatyana, PA-C  Fish Oil OIL Take 1 capsule by mouth daily.   Yes [provider]  fluticasone (FLONASE) 50 MCG/ACT nasal spray  02/06/18  Yes [provider]  glucose blood test strip as directed.   Yes [provider]  ibuprofen (ADVIL,MOTRIN) 600 MG tablet Take 1 tablet (600 mg total) by mouth every 6 (six) hours as needed. 11/03/14  Yes Kirichenko, Tatyana, PA-C  Lancets MISC OneTouch Delica Lancets 33 gauge  check glucose levels 2-3 times a day DX: E11.65, NPI [id number], Date: 03/21/17   Yes [provider]  lisinopril (PRINIVIL,ZESTRIL) 10 MG tablet Take 1 tablet (10 mg total) by mouth daily. 06/06/18 09/04/18 Yes [name], MD  metFORMIN (GLUCOPHAGE-XR) 500 MG 24 hr tablet Take 1 tablet (500 mg total) by mouth daily with breakfast. 06/06/18 09/04/18 Yes Luciano Cinquemani, [name], MD  Multiple Vitamin (MULTIVITAMIN) tablet Take 1 tablet by mouth daily.   Yes [provider]  naproxen (NAPROSYN) 500 MG tablet Take 500 mg by mouth daily.   Yes [provider]  omeprazole (PRILOSEC) 20 MG capsule Take 20 mg by mouth daily.   Yes [provider]  pravastatin  (PRAVACHOL) 20 MG tablet Take 20 mg by mouth daily.   Yes [provider]    No Known Allergies  There are no active problems to display for this patient.   Past Medical History:  Diagnosis Date  . Allergy   . Benign paroxysmal positional vertigo   . Diabetes mellitus without complication (HCC)   . GERD (gastroesophageal reflux disease)   . Hyperlipidemia   . Hypertension     Past Surgical History:  Procedure Laterality Date  . COLONOSCOPY     11-12 yrs ago- normal per pt-pt doesnt remeber where he had the colonoscopy   . HERNIA REPAIR      Social History   Socioeconomic History  . Marital status: Married    Spouse name: Not on file  . Number of children: Not on file  . Years of education: Not on file  . Highest education level: Not on file  Occupational History  . Not on file  Social Needs  . Financial resource strain: Not on file  . Food insecurity:    Worry: Not on file    Inability: Not on file  . Transportation needs:    Medical: Not on file    Non-medical: Not on file  Tobacco Use  . Smoking status: Current Some Day Smoker  . Smokeless tobacco: Never Used  . Tobacco comment: 1- zero a day   Substance and Sexual Activity  . Alcohol use: No  . Drug use: No  . Sexual activity: Not on  file  Lifestyle  . Physical activity:    Days per week: Not on file    Minutes per session: Not on file  . Stress: Not on file  Relationships  . Social connections:    Talks on phone: Not on file    Gets together: Not on file    Attends religious service: Not on file    Active member of club or organization: Not on file    Attends meetings of clubs or organizations: Not on file    Relationship status: Not on file  . Intimate partner violence:    Fear of current or ex partner: Not on file    Emotionally abused: Not on file    Physically abused: Not on file    Forced sexual activity: Not on file  Other Topics Concern  . Not on file  Social History Narrative   . Not on file    Family History  Problem Relation Age of Onset  . Colon cancer Neg Hx   . Colon polyps Neg Hx   . Esophageal cancer Neg Hx   . Rectal cancer Neg Hx   . Stomach cancer Neg Hx      Review of Systems  Constitutional: Positive for malaise/fatigue. Negative for chills and fever.  HENT: Negative.  Negative for sore throat.   Eyes: Negative.  Negative for blurred vision and double vision.  Respiratory: Negative.  Negative for cough, hemoptysis, sputum production, shortness of breath and wheezing.   Cardiovascular: Negative.  Negative for chest pain and palpitations.  Gastrointestinal: Negative.  Negative for abdominal pain, diarrhea, nausea and vomiting.  Genitourinary: Negative.  Negative for dysuria and hematuria.  Musculoskeletal: Negative.  Negative for back pain, myalgias and neck pain.  Skin: Negative.  Negative for rash.  Neurological: Negative.  Negative for dizziness and headaches.  Endo/Heme/Allergies: Negative.   All other systems reviewed and are negative.  Vitals:   06/06/18 1211  BP: 133/73  Pulse: 79  Resp: 16  Temp: 98.3 F (36.8 C)  SpO2: 97%     Physical Exam  Constitutional: He is oriented to person, place, and time. He appears well-developed and well-nourished.  HENT:  Head: Normocephalic and atraumatic.  Right Ear: External ear normal.  Left Ear: External ear normal.  Nose: Nose normal.  Mouth/Throat: Oropharynx is clear and moist.  Eyes: Pupils are equal, round, and reactive to light. Conjunctivae and EOM are normal.  Neck: Normal range of motion. Neck supple.  Cardiovascular: Normal rate, regular rhythm, normal heart sounds and intact distal pulses.  Pulmonary/Chest: Effort normal and breath sounds normal.  Abdominal: Soft. Bowel sounds are normal. He exhibits no distension. There is no tenderness.  Musculoskeletal: Normal range of motion. He exhibits no edema or tenderness.  Neurological: He is alert and oriented to person, place,  and time. No sensory deficit. He exhibits normal muscle tone. Coordination normal.  Skin: Skin is warm. Capillary refill takes less than 2 seconds.  Psychiatric: He has a normal mood and affect. His behavior is normal.  Vitals reviewed.   A total of 25 minutes was spent in the room with the patient, greater than 50% of which was in counseling/coordination of care regarding chronic medical problems, treatment, medications, and need for follow-up.  ASSESSMENT & PLAN: Patrick Park was seen today for generalized body aches and establish care.  Diagnoses and all orders for this visit:  General weakness -     Comprehensive metabolic panel -     TSH -  CBC with Differential/Platelet -     Ambulatory referral to Pulmonology  Tiredness -     Comprehensive metabolic panel -     TSH -     CBC with Differential/Platelet  Dyslipidemia -     Lipid panel  Essential hypertension -     lisinopril (PRINIVIL,ZESTRIL) 10 MG tablet; Take 1 tablet (10 mg total) by mouth daily.  Type 2 diabetes mellitus without complication, without long-term current use of insulin (HCC) -     Hemoglobin A1c -     lisinopril (PRINIVIL,ZESTRIL) 10 MG tablet; Take 1 tablet (10 mg total) by mouth daily. -     metFORMIN (GLUCOPHAGE-XR) 500 MG 24 hr tablet; Take 1 tablet (500 mg total) by mouth daily with breakfast.  Snoring -     Ambulatory referral to Pulmonology    Patient Instructions       If you have lab work done today you will be contacted with your lab results within the next 2 weeks.  If you have not heard from [place] then please contact us. The fastest way to get your results is to register for My Chart.   IF you received an x-ray today, you will receive an invoice from [facility]. Please contact [facility] at [phone] with questions or concerns regarding your invoice.   IF you received labwork today, you will receive an invoice from [facility]. Please contact [facility] at  [phone] with questions or concerns regarding your invoice.   Our billing staff will not be able to assist you with questions regarding bills from these companies.  You will be contacted with the lab results as soon as they are available. The fastest way to get your results is to activate your My Chart account. Instructions are located on the last page of this paperwork. If you have not heard from [place] regarding the results in 2 weeks, please contact this office.     Debilidad (Weakness) La debilidad es la falta de energa. Puede sentir debilidad en todo el cuerpo (generalizada) o puede sentir debilidad en una parte especfica del cuerpo (focal). Hay muchas causas posibles de debilidad. A veces, la causa de la debilidad puede ser desconocida. Algunas causas de debilidad pueden ser graves, por lo que es importante que consulte a un mdico. CUIDADOS EN EL HOGAR  Descanse todo lo que sea necesario.  Trate de dormir lo suficiente. Hable con el mdico acerca de cunto debe dormir cada noche.  [employer] medicamentos de venta libre y los recetados solamente como se lo haya indicado el mdico.  Consuma una dieta sana y [place]. Esto incluye lo siguiente: ? Protenas para fortalecer los msculos, como carne [place] y pescado. ? Nils Pyle y verduras frescas. ? Carbohidratos para tener ms energa, como cereales integrales.  Beba suficiente lquido para mantener el pis (orina) claro o de color amarillo plido.  Haga ejercicios de fuerza, como flexiones de brazos y Metallurgist de las piernas, durante al [employer] veces por semana o como se lo haya indicado el mdico.  Piense en consultar a un fisioterapeuta o Herbalist para que lo ayude a estar ms fuerte.  Concurra a todas las visitas de control como se lo haya indicado el mdico. Esto es importante. SOLICITE AYUDA SI:  La debilidad no mejora o empeora.  La debilidad afecta su capacidad para hacer lo siguiente: ? Pensar  con claridad. ? Realizar sus actividades habituales. SOLICITE AYUDA DE INMEDIATO SI:  Siente debilidad repentina.  Tiene dificultad para respirar  o le falta el aire.  Tiene problemas visuales.  Tiene dificultad para hablar o tragar.  Tiene dificultad para pararse o caminar.  Siente dolor en el pecho.  Se siente mareado.  Se desmaya (pierde el conocimiento). Esta informacin no tiene Theme park manager el consejo del mdico. Asegrese de hacerle al mdico cualquier pregunta que tenga. Document Released: 11/10/2008 Document Revised: 02/13/2012 Document Reviewed: 06/04/2015 Elsevier Interactive Patient Education  2018 Elsevier Inc.      Edwina Barth, MD [facility]

## 2018-06-07 ENCOUNTER — Encounter: Payer: Self-pay | Admitting: *Deleted

## 2018-06-07 ENCOUNTER — Telehealth: Payer: Self-pay | Admitting: Emergency Medicine

## 2018-06-07 LAB — COMPREHENSIVE METABOLIC PANEL
A/G RATIO: 1.6 (ref 1.2–2.2)
ALBUMIN: 4.4 g/dL (ref 3.6–4.8)
ALT: 42 IU/L (ref 0–44)
AST: 38 IU/L (ref 0–40)
Alkaline Phosphatase: 114 IU/L (ref 39–117)
BILIRUBIN TOTAL: 0.3 mg/dL (ref 0.0–1.2)
BUN/Creatinine Ratio: 18 (ref 10–24)
BUN: 12 mg/dL (ref 8–27)
CO2: 21 mmol/L (ref 20–29)
Calcium: 10.1 mg/dL (ref 8.6–10.2)
Chloride: 100 mmol/L (ref 96–106)
Creatinine, Ser: 0.68 mg/dL — ABNORMAL LOW (ref 0.76–1.27)
GFR calc Af Amer: 116 mL/min/{1.73_m2} (ref >59)
GFR calc non Af Amer: 100 mL/min/{1.73_m2} (ref >59)
GLOBULIN, TOTAL: 2.7 g/dL (ref 1.5–4.5)
Glucose: 131 mg/dL — ABNORMAL HIGH (ref 65–99)
POTASSIUM: 4.6 mmol/L (ref 3.5–5.2)
Sodium: 140 mmol/L (ref 134–144)
Total Protein: 7.1 g/dL (ref 6.0–8.5)

## 2018-06-07 LAB — HEMOGLOBIN A1C
ESTIMATED AVERAGE GLUCOSE: 163 mg/dL
Hgb A1c MFr Bld: 7.3 % — ABNORMAL HIGH (ref 4.8–5.6)

## 2018-06-07 LAB — CBC WITH DIFFERENTIAL/PLATELET
Basophils Absolute: 0 10*3/uL (ref 0.0–0.2)
Basos: 0 %
EOS (ABSOLUTE): 0.1 10*3/uL (ref 0.0–0.4)
EOS: 1 %
HEMATOCRIT: 45.5 % (ref 37.5–51.0)
Hemoglobin: 15.5 g/dL (ref 13.0–17.7)
IMMATURE GRANS (ABS): 0 10*3/uL (ref 0.0–0.1)
IMMATURE GRANULOCYTES: 0 %
LYMPHS: 29 %
Lymphocytes Absolute: 2.6 10*3/uL (ref 0.7–3.1)
MCH: 30.6 pg (ref 26.6–33.0)
MCHC: 34.1 g/dL (ref 31.5–35.7)
MCV: 90 fL (ref 79–97)
MONOCYTES: 6 %
Monocytes Absolute: 0.6 10*3/uL (ref 0.1–0.9)
NEUTROS PCT: 64 %
Neutrophils Absolute: 5.8 10*3/uL (ref 1.4–7.0)
Platelets: 250 10*3/uL (ref 150–450)
RBC: 5.07 x10E6/uL (ref 4.14–5.80)
RDW: 13.8 % (ref 12.3–15.4)
WBC: 9.2 10*3/uL (ref 3.4–10.8)

## 2018-06-07 LAB — TSH: TSH: 2.07 u[IU]/mL (ref 0.450–4.500)

## 2018-06-07 LAB — LIPID PANEL
CHOL/HDL RATIO: 3.6 ratio (ref 0.0–5.0)
CHOLESTEROL TOTAL: 138 mg/dL (ref 100–199)
HDL: 38 mg/dL — AB (ref >39)
LDL Calculated: 73 mg/dL (ref 0–99)
TRIGLYCERIDES: 137 mg/dL (ref 0–149)
VLDL Cholesterol Cal: 27 mg/dL (ref 5–40)

## 2018-06-07 MED ORDER — LISINOPRIL 10 MG PO TABS: 10.0000 mg | ORAL_TABLET | Freq: Every day | ORAL | 3 refills | Status: DC

## 2018-06-07 MED ORDER — METFORMIN HCL 500 MG PO TABS: 500.0000 mg | ORAL_TABLET | Freq: Two times a day (BID) | ORAL | 3 refills | Status: DC

## 2018-06-07 MED ORDER — GLIPIZIDE 5 MG PO TABS: 5.0000 mg | ORAL_TABLET | Freq: Every day | ORAL | 3 refills | Status: DC

## 2018-06-07 NOTE — Telephone Encounter (Signed)
Discussed blood results with patient. Will add Glipizide 5mg  daily. Must continue Metformin 500 mg bid.

## 2018-06-07 NOTE — Addendum Note (Signed)
Addended by: Evie Lacks on: 06/07/2018 02:03 PM   Modules accepted: Orders

## 2018-07-11 ENCOUNTER — Institutional Professional Consult (permissible substitution): Payer: Medicare Other | Admitting: Pulmonary Disease

## 2018-08-06 ENCOUNTER — Institutional Professional Consult (permissible substitution): Payer: Medicare Other | Admitting: Pulmonary Disease

## 2018-08-09 ENCOUNTER — Institutional Professional Consult (permissible substitution): Payer: Medicare Other | Admitting: Pulmonary Disease

## 2018-09-02 ENCOUNTER — Ambulatory Visit (INDEPENDENT_AMBULATORY_CARE_PROVIDER_SITE_OTHER): Payer: Medicare Other | Admitting: Pulmonary Disease

## 2018-09-02 ENCOUNTER — Encounter: Payer: Self-pay | Admitting: Pulmonary Disease

## 2018-09-02 VITALS — BP 116/76 | HR 59 | Ht 62.0 in | Wt 195.0 lb

## 2018-09-02 DIAGNOSIS — G4733 Obstructive sleep apnea (adult) (pediatric): Secondary | ICD-10-CM

## 2018-09-02 NOTE — Patient Instructions (Signed)
Moderate probability of obstructive sleep apnea  We will set you up with a home sleep study  I will see you back in the office in about 3 months  Apnea del sueo Sleep Apnea La apnea del sueo afecta la respiracin mientras se duerme. Hace que la respiracin se detenga por poco tiempo o se vuelva superficial. Tambin puede aumentar el riesgo de:  Infarto de miocardio.  Accidente cerebrovascular.  Tener mucho sobrepeso (obesidad).  Diabetes.  Insuficiencia cardaca.  Latidos cardacos irregulares. El Elonobjetivo del tratamiento es ayudarle a respirar normalmente otra vez. Cules son las causas? Existen tres tipos de apnea del sueo:  Apnea obstructiva del sueo. Esta ocurre cuando las vas respiratorias se obstruyen o colapsan.  Apnea central del sueo. Esta ocurre cuando el cerebro no enva las seales correctas a los msculos que controlan la respiracin.  Apnea mixta del sueo. Esta es una combinacin de apnea obstructiva y central del sueo. La causa ms frecuente de esta afeccin es la obstruccin o el colapso de las vas respiratorias. Esto puede suceder si:  Los msculos de la garganta estn demasiado relajados.  Tiene la lengua y las [address] demasiado grandes.  Tiene sobrepeso.  Tiene las vas respiratorias demasiado pequeas. Qu incrementa el riesgo?  Tener sobrepeso.  Fumar.  Tener vas respiratorias pequeas.  El envejecimiento.  Ser hombre.  El consumo de alcohol.  Tomar medicamentos para calmarse (sedantes o tranquilizantes).  Tener familiares con esta afeccin. Cules son los signos o los sntomas?  Dificultad para permanecer dormido.  Estar somnoliento o cansado Administratordurante el da.  Enojarse mucho.  Ronquidos fuertes.  Dolor de cabeza por la maana.  Imposibilidad de enfocar la mente (concentrarse).  Olvidar cosas.  Menos inters por el sexo.  Cambios en el estado de nimo.  Cambios en la personalidad.  Sentimientos de  tristeza (depresin).  Levantarse mucho durante la noche para ir a Geographical information systems officerorinar.  Sequedad en la boca.  Dolor de Advertising copywritergarganta. Cmo se diagnostica?  Sus antecedentes mdicos.  Un examen fsico.  Neomia DearUna prueba que se realiza mientras la persona duerme (estudio del sueo). La prueba se realiza con mayor frecuencia en un laboratorio del sueo, pero tambin puede Management consultantrealizarse en el hogar. Cmo se trata?   Dormir de Mudloggercostado.  Usar un medicamento para eliminar la mucosidad de la nariz (descongestivo).  Evitar el consumo de alcohol, medicamentos que ayudan a relajarse o ciertos analgsicos (narcticos).  Bajar de Lowmanpeso, si es necesario.  Cambios en la dieta.  No fumar.  Usar una mquina para abrir las vas respiratorias mientras duerme; por ejemplo: ? Un aparato bucal. Se trata de una boquilla que desplaza la mandbula hacia adelante. ? Un dispositivo CPAP. Este dispositivo sopla aire a travs de una mscara cuando usted exhala. ? Un dispositivo EPAP. Este tiene vlvulas que se colocan en cada fosa nasal. ? Un dispositivo BPAP. Este dispositivo sopla aire a travs de una mscara cuando usted inhala y exhala.  Someterse a Biomedical engineerciruga si los dems tratamientos no Comptrollerresultan eficaces. Realizar un tratamiento para la apnea del sueo es importante. Sin tratamiento, esta afeccin puede derivar en lo siguiente:  Presin arterial alta.  Arteriopata coronaria.  En los hombres, no poder tener una ereccin (impotencia).  Reduccin de la capacidad de pensar. Siga estas instrucciones en su casa: Estilo de MeadWestvacovida  Haga los cambios que le haya recomendado el mdico.  Siga una dieta saludable.  Baje de peso, si es necesario.  Evite el alcohol, los medicamentos para relajarse y Scientific laboratory technicianalgunos analgsicos.  No consuma ningn producto que contenga nicotina o tabaco, como cigarrillos, cigarrillos electrnicos y tabaco de Theatre managermascar. Si necesita ayuda para dejar de fumar, consulte al American Expressmdico. Instrucciones  generales  Baxter Internationalome los medicamentos de venta libre y los recetados solamente como se lo haya indicado el mdico.  Si le proporcionaron una mquina para usar mientras duerme, sela solamente como se lo haya indicado el mdico.  Si va a someterse a [place] ciruga, no olvide informarle al mdico que tiene apnea del sueo. Puede ser necesario que lleve su dispositivo consigo.  Concurra a todas las visitas de [address]seguimiento como se lo haya indicado el mdico. Esto es importante. Comunquese con un mdico si:  El Astronomerdispositivo que le proporcionaron para usar mientras duerme le Coinjockmolesta o parece no funcionar.  No se siente mejor.  Empeora. Solicite ayuda inmediatamente si:  Le duele el pecho.  Tiene dificultad para inhalar suficiente aire.  Tiene molestias en la espalda, en los brazos o en el Endicottestmago.  Tiene dificultad para hablar.  Siente debilidad en un lado del cuerpo.  Se le cae un lado de la cara. Estos sntomas pueden Customer service managerindicar una emergencia. No espere a ver si los sntomas desaparecen. Solicite atencin mdica de inmediato. Comunquese con el servicio de emergencias de su localidad (911 en los [place]). No conduzca por sus propios medios Dollar Generalhasta el hospital. Resumen  Esta afeccin afecta la respiracin durante el sueo.  La causa ms frecuente es la obstruccin o el colapso de las vas respiratorias.  El Marquezobjetivo del tratamiento es ayudarlo a respirar normalmente mientras duerme. Esta informacin no tiene Theme park managercomo fin reemplazar el consejo del mdico. Asegrese de hacerle al mdico cualquier pregunta que tenga. Document Released: 09/16/2010 Document Revised: 05/08/2018 Document Reviewed: 05/08/2018 Elsevier Interactive Patient Education  [employer]2019 Elsevier Inc.

## 2018-09-02 NOTE — Progress Notes (Signed)
Patrick HeckReynaldo Park    [id number]    Sep 16, 1950  Primary Care Physician:[name], Eilleen KempfMiguel Jose, MD  Referring Physician: Georgina QuintSagardia, [name], MD [address] [place], [place] [address]  Chief complaint:   Patient with a history of snoring In for evaluation for possible sleep apnea  HPI:  Patient had a negative study about 15 years ago History of snoring, denies witnessed apneas Sleep time is variable Wakes up a couple of times during the night Final wake up time about 8 AM  Denies any dryness of his mouth Denies any headaches Memory is fine No family history of obstructive sleep apnea  Occasionally does have allergy symptoms causing nasal stuffiness and congestion  Denies any underlying history of heart disease, no underlying history of lung disease Denies any chest pains or chest discomfort Outpatient Encounter Medications as of 09/02/2018  Medication Sig  . albuterol (PROVENTIL HFA;VENTOLIN HFA) 108 (90 BASE) MCG/ACT inhaler Inhale 2 puffs into the lungs every 4 (four) hours as needed for wheezing or shortness of breath.  . Fish Oil OIL Take 1 capsule by mouth daily.  . fluticasone (FLONASE) 50 MCG/ACT nasal spray   . glipiZIDE (GLUCOTROL) 5 MG tablet Take 1 tablet (5 mg total) by mouth daily before breakfast.  . glucose blood test strip as directed.  Marland Kitchen. ibuprofen (ADVIL,MOTRIN) 600 MG tablet Take 1 tablet (600 mg total) by mouth every 6 (six) hours as needed.  . Lancets MISC OneTouch Delica Lancets 33 gauge  check glucose levels 2-3 times a day DX: E11.65, NPI [id number], Date: 03/21/17  . lisinopril (PRINIVIL,ZESTRIL) 10 MG tablet Take 1 tablet (10 mg total) by mouth daily.  . metFORMIN (GLUCOPHAGE) 500 MG tablet Take 1 tablet (500 mg total) by mouth 2 (two) times daily with a meal.  . Multiple Vitamin (MULTIVITAMIN) tablet Take 1 tablet by mouth daily.  . naproxen (NAPROSYN) 500 MG tablet Take 500 mg by mouth daily.  Marland Kitchen. omeprazole (PRILOSEC) 20 MG capsule Take 20 mg  by mouth daily.  . pravastatin (PRAVACHOL) 20 MG tablet Take 20 mg by mouth daily.   Facility-Administered Encounter Medications as of 09/02/2018  Medication  . 0.9 %  sodium chloride infusion    Allergies as of 09/02/2018  . (No Known Allergies)    Past Medical History:  Diagnosis Date  . Allergy   . Benign paroxysmal positional vertigo   . Diabetes mellitus without complication (HCC)   . GERD (gastroesophageal reflux disease)   . Hyperlipidemia   . Hypertension     Past Surgical History:  Procedure Laterality Date  . COLONOSCOPY     11-12 yrs ago- normal per pt-pt doesnt remeber where he had the colonoscopy   . HERNIA REPAIR      Family History  Problem Relation Age of Onset  . Colon cancer Neg Hx   . Colon polyps Neg Hx   . Esophageal cancer Neg Hx   . Rectal cancer Neg Hx   . Stomach cancer Neg Hx     Social History   Socioeconomic History  . Marital status: Married    Spouse name: Not on file  . Number of children: Not on file  . Years of education: Not on file  . Highest education level: Not on file  Occupational History  . Not on file  Social Needs  . Financial resource strain: Not on file  . Food insecurity:    Worry: Not on file    Inability: Not  on file  . Transportation needs:    Medical: Not on file    Non-medical: Not on file  Tobacco Use  . Smoking status: Current Some Day Smoker  . Smokeless tobacco: Never Used  . Tobacco comment: 1- zero a day   Substance and Sexual Activity  . Alcohol use: No  . Drug use: No  . Sexual activity: Not on file  Lifestyle  . Physical activity:    Days per week: Not on file    Minutes per session: Not on file  . Stress: Not on file  Relationships  . Social connections:    Talks on phone: Not on file    Gets together: Not on file    Attends religious service: Not on file    Active member of club or organization: Not on file    Attends meetings of clubs or organizations: Not on file    Relationship  status: Not on file  . Intimate partner violence:    Fear of current or ex partner: Not on file    Emotionally abused: Not on file    Physically abused: Not on file    Forced sexual activity: Not on file  Other Topics Concern  . Not on file  Social History Narrative  . Not on file    Review of Systems  Constitutional: Negative.   HENT: Negative.   Eyes: Negative.   Respiratory: Negative.   Cardiovascular: Negative.   Gastrointestinal: Negative.   Psychiatric/Behavioral: Positive for sleep disturbance.  All other systems reviewed and are negative.   Vitals:   09/02/18 1057  BP: 116/76  Pulse: (!) 59  SpO2: 98%     Physical Exam  Constitutional: He appears well-developed and well-nourished.  HENT:  Head: Normocephalic.  mallampati 3  Eyes: Pupils are equal, round, and reactive to light. Conjunctivae are normal. Right eye exhibits no discharge.  Neck: Normal range of motion. Neck supple. No tracheal deviation present. No thyromegaly present.  Cardiovascular: Normal rate and regular rhythm.  Pulmonary/Chest: Effort normal and breath sounds normal. No respiratory distress. He has no wheezes. He has no rales.  Abdominal: Soft. Bowel sounds are normal.   Data Reviewed: Results of the [facility] flowsheet 09/02/2018  Sitting and reading 3  Watching TV 3  Sitting, inactive in a public place (e.g. a theatre or a meeting) 0  As a passenger in a car for an hour without a break 0  Lying down to rest in the afternoon when circumstances permit 0  Sitting and talking to someone 0  Sitting quietly after a lunch without alcohol 3  In a car, while stopped for a few minutes in traffic 0  Total score 9   Assessment:  Moderate probability of significant obstructive sleep apnea  Snoring  Daytime sleepiness  Morbid obesity  Plan/Recommendations:  We will schedule the patient for home sleep study  Pathophysiology of sleep disordered breathing discussed with the patient  Options  of treatment discussed with patient  Importance of weight loss and exercise discussed with the patient   I will see him back in the office in about 3 months Encouraged to call if any significant concerns Virl Diamond MD [facility] 09/02/2018, 11:09 AM  CC: [name], *

## 2018-10-03 ENCOUNTER — Encounter: Payer: Self-pay | Admitting: Emergency Medicine

## 2018-10-03 ENCOUNTER — Other Ambulatory Visit: Payer: Self-pay

## 2018-10-03 ENCOUNTER — Ambulatory Visit (INDEPENDENT_AMBULATORY_CARE_PROVIDER_SITE_OTHER): Payer: Medicare Other | Admitting: Emergency Medicine

## 2018-10-03 VITALS — BP 131/69 | HR 73 | Temp 98.5°F | Resp 16 | Ht 62.25 in | Wt 198.2 lb

## 2018-10-03 DIAGNOSIS — R531 Weakness: Secondary | ICD-10-CM | POA: Diagnosis not present

## 2018-10-03 DIAGNOSIS — E1159 Type 2 diabetes mellitus with other circulatory complications: Secondary | ICD-10-CM | POA: Insufficient documentation

## 2018-10-03 DIAGNOSIS — E1165 Type 2 diabetes mellitus with hyperglycemia: Secondary | ICD-10-CM | POA: Diagnosis not present

## 2018-10-03 DIAGNOSIS — I10 Essential (primary) hypertension: Secondary | ICD-10-CM | POA: Diagnosis not present

## 2018-10-03 DIAGNOSIS — R6889 Other general symptoms and signs: Secondary | ICD-10-CM | POA: Diagnosis not present

## 2018-10-03 DIAGNOSIS — E785 Hyperlipidemia, unspecified: Secondary | ICD-10-CM

## 2018-10-03 DIAGNOSIS — E1169 Type 2 diabetes mellitus with other specified complication: Secondary | ICD-10-CM | POA: Insufficient documentation

## 2018-10-03 DIAGNOSIS — I152 Hypertension secondary to endocrine disorders: Secondary | ICD-10-CM | POA: Insufficient documentation

## 2018-10-03 LAB — GLUCOSE, POCT (MANUAL RESULT ENTRY): POC Glucose: 142 mg/dl — AB (ref 70–99)

## 2018-10-03 LAB — POCT GLYCOSYLATED HEMOGLOBIN (HGB A1C): Hemoglobin A1C: 7.7 % — AB (ref 4.0–5.6)

## 2018-10-03 LAB — POC INFLUENZA A&B (BINAX/QUICKVUE)
Influenza A, POC: NEGATIVE
Influenza B, POC: NEGATIVE

## 2018-10-03 MED ORDER — GLUCOSE BLOOD VI STRP: 1.0000 | ORAL_STRIP | 5 refills | Status: DC

## 2018-10-03 NOTE — Assessment & Plan Note (Signed)
Uncontrolled diabetes with hemoglobin A1c at 7.7 today, higher than before.  Will increase glipizide to 5 mg twice a day along with metformin 500 mg twice a day.  Follow-up in 3 months.

## 2018-10-03 NOTE — Patient Instructions (Addendum)
   If you have lab work done today you will be contacted with your lab results within the next 2 weeks.  If you have not heard from us then please contact us. The fastest way to get your results is to register for My Chart.   IF you received an x-ray today, you will receive an invoice from [facility]. Please contact [facility] at [phone] with questions or concerns regarding your invoice.   IF you received labwork today, you will receive an invoice from [facility]. Please contact [facility] at [phone] with questions or concerns regarding your invoice.   Our billing staff will not be able to assist you with questions regarding bills from these companies.  You will be contacted with the lab results as soon as they are available. The fastest way to get your results is to activate your My Chart account. Instructions are located on the last page of this paperwork. If you have not heard from us regarding the results in 2 weeks, please contact this office.     Diabetes mellitus y nutricin, en adultos Diabetes Mellitus and Nutrition, Adult Si sufre de diabetes (diabetes mellitus), es muy importante tener hbitos alimenticios saludables debido a que sus niveles de azcar en la sangre (glucosa) se ven afectados en gran medida por lo que come y bebe. Comer alimentos saludables en las cantidades adecuadas, aproximadamente a la misma hora todos los das, lo ayudar a:  Controlar la glucemia.  Disminuir el riesgo de sufrir una enfermedad cardaca.  Mejorar la presin arterial.  Alcanzar o mantener un peso saludable. Todas las personas que sufren de diabetes son diferentes y cada una tiene necesidades diferentes en cuanto a un plan de alimentacin. El mdico puede recomendarle que trabaje con un especialista en dietas y nutricin (nutricionista) para elaborar el mejor plan para usted. Su plan de alimentacin puede variar segn factores como:  Las caloras que  necesita.  Los medicamentos que toma.  Su peso.  Sus niveles de glucemia, presin arterial y colesterol.  Su nivel de actividad.  Otras afecciones que tenga, como enfermedades cardacas o renales. Cmo me afectan los carbohidratos? Los carbohidratos, o hidratos de carbono, afectan su nivel de glucemia ms que cualquier otro tipo de alimento. La ingesta de carbohidratos naturalmente aumenta la cantidad de glucosa en la sangre. El recuento de carbohidratos es un mtodo destinado a llevar un registro de la cantidad de carbohidratos que se consumen. El recuento de carbohidratos es importante para mantener la glucemia a un nivel saludable, especialmente si utiliza insulina o toma determinados medicamentos por va oral para la diabetes. Es importante conocer la cantidad de carbohidratos que se pueden ingerir en cada comida sin correr ningn riesgo. Esto es diferente en cada persona. Su nutricionista puede ayudarlo a calcular la cantidad de carbohidratos que debe ingerir en cada comida y en cada refrigerio. Entre los alimentos que contienen carbohidratos, se incluyen:  Pan, cereal, arroz, pastas y galletas.  Papas y maz.  Guisantes, frijoles y lentejas.  Leche y yogur.  Frutas y jugo.  Postres, como pasteles, galletas, helado y caramelos. Cmo me afecta el alcohol? El alcohol puede provocar disminuciones sbitas de la glucemia (hipoglucemia), especialmente si utiliza insulina o toma determinados medicamentos por va oral para la diabetes. La hipoglucemia es una afeccin potencialmente mortal. Los sntomas de la hipoglucemia (somnolencia, mareos y confusin) son similares a los sntomas de haber consumido demasiado alcohol. Si el mdico afirma que el alcohol es seguro para usted, siga estas pautas:    Limite el consumo de alcohol a no ms de 1medida por da si es mujer y no est embarazada, y a 2medidas si es hombre. Una medida equivale a 12oz (355ml) de cerveza, 5oz (148ml) de vino o  1oz (44ml) de bebidas alcohlicas de alta graduacin.  No beba con el estmago vaco.  Mantngase hidratado bebiendo agua, refrescos dietticos o t helado sin azcar.  Tenga en cuenta que los refrescos comunes, los jugos y otras bebida para mezclar pueden contener mucha azcar y se deben contar como carbohidratos. Cules son algunos consejos para seguir este plan?  Leer las etiquetas de los alimentos  Comience por leer el tamao de la porcin en la "Informacin nutricional" en las etiquetas de los alimentos envasados y las bebidas. La cantidad de caloras, carbohidratos, grasas y otros nutrientes mencionados en la etiqueta se basan en una porcin del alimento. Muchos alimentos contienen ms de una porcin por envase.  Verifique la cantidad total de gramos (g) de carbohidratos totales en una porcin. Puede calcular la cantidad de porciones de carbohidratos al dividir el total de carbohidratos por 15. Por ejemplo, si un alimento tiene un total de 30g de carbohidratos, equivale a 2 porciones de carbohidratos.  Verifique la cantidad de gramos (g) de grasas saturadas y grasas trans en una porcin. Escoja alimentos que no contengan grasa o que tengan un bajo contenido.  Verifique la cantidad de miligramos (mg) de sal (sodio) en una porcin. La mayora de las personas deben limitar la ingesta de sodio total a menos de 2300mg por da.  Siempre consulte la informacin nutricional de los alimentos etiquetados como "con bajo contenido de grasa" o "sin grasa". Estos alimentos pueden tener un mayor contenido de azcar agregada o carbohidratos refinados, y deben evitarse.  Hable con su nutricionista para identificar sus objetivos diarios en cuanto a los nutrientes mencionados en la etiqueta. Al ir de compras  Evite comprar alimentos procesados, enlatados o precocinados. Estos alimentos tienden a tener una mayor cantidad de grasa, sodio y azcar agregada.  Compre en la zona exterior de la tienda de  comestibles. Esta zona incluye frutas y verduras frescas, granos a granel, carnes frescas y productos lcteos frescos. Al cocinar  Utilice mtodos de coccin a baja temperatura, como hornear, en lugar de mtodos de coccin a alta temperatura, como frer en abundante aceite.  Cocine con aceites saludables, como el aceite de oliva, canola o girasol.  Evite cocinar con manteca, crema o carnes con alto contenido de grasa. Planificacin de las comidas  Coma las comidas y los refrigerios regularmente, preferentemente a la misma hora todos los das. Evite pasar largos perodos de tiempo sin comer.  Consuma alimentos ricos en fibra, como frutas frescas, verduras, frijoles y cereales integrales. Consulte a su nutricionista sobre cuntas porciones de carbohidratos puede consumir en cada comida.  Consuma entre 4 y 6 onzas (oz) de protenas magras por da, como carnes magras, pollo, pescado, huevos o tofu. Una onza de protena magra equivale a: ? 1 onza de carne, pollo o pescado. ? 1huevo. ?  taza de tofu.  Coma algunos alimentos por da que contengan grasas saludables, como aguacates, frutos secos, semillas y pescado. Estilo de vida  Controle su nivel de glucemia con regularidad.  Haga actividad fsica habitualmente como se lo haya indicado el mdico. Esto puede incluir lo siguiente: ? 150minutos semanales de ejercicio de intensidad moderada o alta. Esto podra incluir caminatas dinmicas, ciclismo o gimnasia acutica. ? Realizar ejercicios de elongacin y de fortalecimiento, como yoga o levantamiento   de pesas, por lo menos 2veces por semana.  Tome los [employer] Companymedicamentos como se lo haya indicado el mdico.  No consuma ningn producto que contenga nicotina o tabaco, como cigarrillos y Administrator, Civil Servicecigarrillos electrnicos. Si necesita ayuda para dejar de fumar, consulte al CIGNAmdico.  Trabaje con un asesor o instructor en diabetes para identificar estrategias para controlar el estrs y cualquier desafo emocional  y social. Preguntas para hacerle al mdico  Es necesario que consulte a IT trainerun instructor en el cuidado de la diabetes?  Es necesario que me rena con un nutricionista?  A qu nmero puedo llamar si tengo preguntas?  Cules son los mejores momentos para controlar la glucemia? Dnde encontrar ms informacin:  Asociacin Estadounidense de la Diabetes (American Diabetes Association): diabetes.org  Academia de Nutricin y Pension scheme managerDiettica (Academy of Nutrition and Dietetics): www.eatright.org  The [employer]ger[employer]stituto Nacional de la Diabetes y las Enfermedades Digestivas y [facility](National Institute of Diabetes and Digestive and Kidney Diseases, NIH): CarFlippers.tnwww.niddk.nih.gov Resumen  Un plan de alimentacin saludable lo ayudar a Scientist, physiologicalcontrolar la glucemia y Pharmacologistmantener un estilo de vida saludable.  Trabajar con un especialista en dietas y nutricin (nutricionista) puede ayudarlo a Designer, television/film setelaborar el mejor plan de alimentacin para usted.  Tenga en cuenta que los carbohidratos (hidratos de carbono) y el alcohol tienen efectos inmediatos en sus niveles de glucemia. Es importante contar los carbohidratos que ingiere y consumir alcohol con prudencia. Esta informacin no tiene Theme park managercomo fin reemplazar el consejo del mdico. Asegrese de hacerle al mdico cualquier pregunta que tenga. Document Released: 11/21/2007 Document Revised: 04/24/2017 Document Reviewed: 12/04/2016 Elsevier Interactive Patient Education  2019 Elsevier Inc.  Viral Respiratory Infection A viral respiratory infection is an illness that affects parts of the body that are used for breathing. These include the lungs, nose, and throat. It is caused by a germ called a virus. Some examples of this kind of infection are:  A cold.  The flu (influenza).  A respiratory syncytial virus (RSV) infection. A person who gets this illness may have the following symptoms:  A stuffy or runny nose.  Yellow or green fluid in the nose.  A cough.  Sneezing.  Tiredness  (fatigue).  Achy muscles.  A sore throat.  Sweating or chills.  A fever.  A headache. Follow these instructions at home: Managing pain and congestion  Take over-the-counter and prescription medicines only as told by your doctor.  If you have a sore throat, gargle with salt water. Do this 3-4 times per day or as needed. To make a salt-water mixture, dissolve -1 tsp of salt in 1 cup of warm water. Make sure that all the salt dissolves.  Use nose drops made from salt water. This helps with stuffiness (congestion). It also helps soften the skin around your nose.  Drink enough fluid to keep your pee (urine) pale yellow. General instructions   Rest as much as possible.  Do not drink alcohol.  Do not use any products that have nicotine or tobacco, such as cigarettes and e-cigarettes. If you need help quitting, ask your doctor.  Keep all follow-up visits as told by your doctor. This is important. How is this prevented?   Get a flu shot every year. Ask your doctor when you should get your flu shot.  Do not let other people get your germs. If you are sick: ? Stay home from work or school. ? Wash your hands with soap and water often. Wash your hands after you cough or sneeze. If soap and water are not available, use  hand sanitizer.  Avoid contact with people who are sick during cold and flu season. This is in fall and winter. Get help if:  Your symptoms last for 10 days or longer.  Your symptoms get worse over time.  You have a fever.  You have very bad pain in your face or forehead.  Parts of your jaw or neck become very swollen. Get help right away if:  You feel pain or pressure in your chest.  You have shortness of breath.  You faint or feel like you will faint.  You keep throwing up (vomiting).  You feel confused. Summary  A viral respiratory infection is an illness that affects parts of the body that are used for breathing.  Examples of this illness  include a cold, the flu, and respiratory syncytial virus (RSV) infection.  The infection can cause a runny nose, cough, sneezing, sore throat, and fever.  Follow what your doctor tells you about taking medicines, drinking lots of fluid, washing your hands, resting at home, and avoiding people who are sick. This information is not intended to replace advice given to you by your health care provider. Make sure you discuss any questions you have with your health care provider. Document Released: 07/27/2008 Document Revised: 09/24/2017 Document Reviewed: 09/24/2017 Elsevier Interactive Patient Education  2019 [employer].

## 2018-10-03 NOTE — Progress Notes (Signed)
[place] Park 68 y.o.   Chief Complaint  Patient presents with  . Cough    productive yellow and green mucus x 1 week   . Nasal Congestion    HISTORY OF PRESENT ILLNESS: This is a 68 y.o. male diabetic and hypertensive complaining of one-week history of cough productive of green phlegm with nasal congestion and generalized body aches.  HPI   Prior to Admission medications   Medication Sig Start Date End Date Taking? Authorizing Provider  albuterol (PROVENTIL HFA;VENTOLIN HFA) 108 (90 BASE) MCG/ACT inhaler Inhale 2 puffs into the lungs every 4 (four) hours as needed for wheezing or shortness of breath. 11/03/14  Yes Kirichenko, Tatyana, PA-C  Fish Oil OIL Take 1 capsule by mouth daily.   Yes [provider]  glipiZIDE (GLUCOTROL) 5 MG tablet Take 1 tablet (5 mg total) by mouth daily before breakfast. 06/07/18  Yes Rodolfo Gaster, [name], MD  glucose blood test strip as directed.   Yes [provider]  ibuprofen (ADVIL,MOTRIN) 600 MG tablet Take 1 tablet (600 mg total) by mouth every 6 (six) hours as needed. 11/03/14  Yes Kirichenko, Tatyana, PA-C  metFORMIN (GLUCOPHAGE) 500 MG tablet Take 1 tablet (500 mg total) by mouth 2 (two) times daily with a meal. 06/07/18  Yes Garett Tetzloff, [name], MD  Multiple Vitamin (MULTIVITAMIN) tablet Take 1 tablet by mouth daily.   Yes [provider]  naproxen (NAPROSYN) 500 MG tablet Take 500 mg by mouth daily.   Yes [provider]  omeprazole (PRILOSEC) 20 MG capsule Take 20 mg by mouth daily.   Yes [provider]  pravastatin (PRAVACHOL) 20 MG tablet Take 20 mg by mouth daily.   Yes [provider]  fluticasone Aleda Grana) 50 MCG/ACT nasal spray  02/06/18   [provider]  Lancets MISC OneTouch Delica Lancets 33 gauge  check glucose levels 2-3 times a day DX: E11.65, NPI [id number], Date: 03/21/17    [provider]  lisinopril (PRINIVIL,ZESTRIL) 10 MG tablet Take 1 tablet (10 mg  total) by mouth daily. 06/07/18 09/05/18  [name], MD    No Known Allergies  There are no active problems to display for this patient.   Past Medical History:  Diagnosis Date  . Allergy   . Benign paroxysmal positional vertigo   . Diabetes mellitus without complication (HCC)   . GERD (gastroesophageal reflux disease)   . Hyperlipidemia   . Hypertension     Past Surgical History:  Procedure Laterality Date  . COLONOSCOPY     11-12 yrs ago- normal per pt-pt doesnt remeber where he had the colonoscopy   . HERNIA REPAIR      Social History   Socioeconomic History  . Marital status: Married    Spouse name: Not on file  . Number of children: Not on file  . Years of education: Not on file  . Highest education level: Not on file  Occupational History  . Not on file  Social Needs  . Financial resource strain: Not on file  . Food insecurity:    Worry: Not on file    Inability: Not on file  . Transportation needs:    Medical: Not on file    Non-medical: Not on file  Tobacco Use  . Smoking status: Current Some Day Smoker  . Smokeless tobacco: Never Used  . Tobacco comment: 1- zero a day   Substance and Sexual Activity  . Alcohol use: No  . Drug use: No  .  Sexual activity: Not on file  Lifestyle  . Physical activity:    Days per week: Not on file    Minutes per session: Not on file  . Stress: Not on file  Relationships  . Social connections:    Talks on phone: Not on file    Gets together: Not on file    Attends religious service: Not on file    Active member of club or organization: Not on file    Attends meetings of clubs or organizations: Not on file    Relationship status: Not on file  . Intimate partner violence:    Fear of current or ex partner: Not on file    Emotionally abused: Not on file    Physically abused: Not on file    Forced sexual activity: Not on file  Other Topics Concern  . Not on file  Social History Narrative  . Not on file      Family History  Problem Relation Age of Onset  . Colon cancer Neg Hx   . Colon polyps Neg Hx   . Esophageal cancer Neg Hx   . Rectal cancer Neg Hx   . Stomach cancer Neg Hx      Review of Systems  Constitutional: Positive for malaise/fatigue. Negative for chills and fever.  HENT: Negative.  Negative for hearing loss and nosebleeds.   Eyes: Negative.  Negative for blurred vision and double vision.  Respiratory: Negative.  Negative for cough and shortness of breath.   Cardiovascular: Negative.  Negative for chest pain and palpitations.  Gastrointestinal: Negative for abdominal pain, diarrhea, nausea and vomiting.  Genitourinary: Negative.   Musculoskeletal: Negative for myalgias and neck pain.  Skin: Negative.  Negative for rash.  Neurological: Positive for weakness. Negative for dizziness and headaches.  All other systems reviewed and are negative.   Vitals:   10/03/18 1109  BP: 131/69  Pulse: 73  Resp: 16  Temp: 98.5 F (36.9 C)  SpO2: 97%    Physical Exam Vitals signs reviewed.  Constitutional:      Appearance: Normal appearance.  HENT:     Head: Normocephalic and atraumatic.     Nose: Nose normal.     Mouth/Throat:     Mouth: Mucous membranes are moist.     Pharynx: Oropharynx is clear.  Cardiovascular:     Rate and Rhythm: Normal rate and regular rhythm.     Heart sounds: Normal heart sounds.  Pulmonary:     Effort: Pulmonary effort is normal.     Breath sounds: Normal breath sounds.  Abdominal:     Palpations: Abdomen is soft.     Tenderness: There is no abdominal tenderness.  Musculoskeletal: Normal range of motion.  Skin:    General: Skin is warm and dry.     Capillary Refill: Capillary refill takes less than 2 seconds.  Neurological:     General: No focal deficit present.     Mental Status: He is alert and oriented to person, place, and time.  Psychiatric:        Mood and Affect: Mood normal.        Behavior: Behavior normal.       ASSESSMENT & PLAN: Type 2 diabetes mellitus with hyperglycemia, without long-term current use of insulin (HCC) Uncontrolled diabetes with hemoglobin A1c at 7.7 today, higher than before.  Will increase glipizide to 5 mg twice a day along with metformin 500 mg twice a day.  Follow-up in 3 months.  Donnetta SimpersReynaldo was  seen today for cough and nasal congestion.  Diagnoses and all orders for this visit:  Type 2 diabetes mellitus with hyperglycemia, without long-term current use of insulin (HCC) -     Microalbumin, urine -     POCT glucose (manual entry) -     POCT glycosylated hemoglobin (Hb A1C) -     Comprehensive metabolic panel -     CBC with Differential/Platelet -     Lipid panel -     glucose blood test strip; 1 each by Other route as directed. -     Ambulatory referral to Ophthalmology  General weakness -     TestT+TestF+SHBG  Dyslipidemia -     Lipid panel  Essential hypertension -     Comprehensive metabolic panel -     CBC with Differential/Platelet  Flu-like symptoms -     POC Influenza A&B(BINAX/QUICKVUE)    Patient Instructions       If you have lab work done today you will be contacted with your lab results within the next 2 weeks.  If you have not heard from [place] then please contact us. The fastest way to get your results is to register for My Chart.   IF you received an x-ray today, you will receive an invoice from [facility]. Please contact [facility] at [phone] with questions or concerns regarding your invoice.   IF you received labwork today, you will receive an invoice from [place]. Please contact [facility] at [phone] with questions or concerns regarding your invoice.   Our billing staff will not be able to assist you with questions regarding bills from these companies.  You will be contacted with the lab results as soon as they are available. The fastest way to get your results is to activate your My Chart account.  Instructions are located on the last page of this paperwork. If you have not heard from [place] regarding the results in 2 weeks, please contact this office.     Diabetes mellitus y nutricin, en adultos Diabetes Mellitus and Nutrition, Adult Si sufre de diabetes (diabetes mellitus), es muy importante tener hbitos alimenticios saludables debido a que sus niveles de Psychologist, counselling sangre (glucosa) se ven afectados en gran medida por lo que come y bebe. [place] alimentos saludables en las cantidades [place], aproximadamente a la [employer], [place] ayudar a:  Scientist, physiological glucemia.  Disminuir el riesgo de sufrir una enfermedad cardaca.  Mejorar la presin arterial.  Barista o mantener un peso saludable. Todas las personas que sufren de diabetes son diferentes y cada una tiene necesidades diferentes en cuanto a un plan de alimentacin. El mdico puede recomendarle que trabaje con un especialista en dietas y nutricin (nutricionista) para Tax adviser plan para usted. Su plan de alimentacin puede variar segn factores como:  Las caloras que necesita.  Los medicamentos que toma.  Su peso.  Sus niveles de glucemia, presin arterial y colesterol.  Su nivel de [place].  Otras afecciones que tenga, como enfermedades cardacas o renales. Cmo me afectan los carbohidratos? Los carbohidratos, o hidratos de carbono, afectan su nivel de glucemia ms que cualquier otro tipo de alimento. La ingesta de carbohidratos naturalmente aumenta la cantidad de [employer]. El recuento de carbohidratos es un mtodo destinado a Midwife un registro de la cantidad de carbohidratos que se consumen. El recuento de carbohidratos es importante para Pharmacologist la glucemia a un nivel saludable, especialmente si utiliza insulina o toma determinados medicamentos por  va oral para la diabetes. Es importante conocer la cantidad de carbohidratos que se pueden ingerir en cada comida sin correr Geneticist, molecularningn  riesgo. Esto es Government social research officerdiferente en cada persona. Su nutricionista puede ayudarlo a calcular la cantidad de carbohidratos que debe ingerir en cada comida y en cada refrigerio. Entre los alimentos que contienen carbohidratos, se incluyen:  Pan, cereal, arroz, pastas y galletas.  Papas y maz.  Guisantes, frijoles y lentejas.  Leche y Dentistyogur.  Nils PyleFrutas y Sloveniajugo.  Postres, como pasteles, galletas, helado y caramelos. Cmo me afecta el alcohol? El alcohol puede provocar disminuciones sbitas de la glucemia (hipoglucemia), especialmente si utiliza insulina o toma determinados medicamentos por va oral para la diabetes. La hipoglucemia es una afeccin potencialmente mortal. Los sntomas de la hipoglucemia (somnolencia, mareos y confusin) son similares a los sntomas de haber consumido demasiado alcohol. Si el mdico afirma que el alcohol es seguro para usted, [place] estas pautas:  Limite el consumo de alcohol a no ms de 1medida por da si es mujer y no est [place] Ne[place]embarazada, y a 2medidas si es hombre. Una medida equivale a 12oz (355ml) de cerveza, 5oz (148ml) de vino o 1oz (44ml) de bebidas alcohlicas de alta graduacin.  No beba con el estmago vaco.  Mantngase hidratado bebiendo agua, refrescos dietticos o t helado sin azcar.  Tenga en cuenta que los refrescos comunes, los jugos y otras bebida para Engineer, manufacturingmezclar pueden contener mucha azcar y se deben contar como carbohidratos. Cules son algunos consejos para seguir este plan?  Leer las etiquetas de los alimentos  Comience por leer el tamao de la porcin en la "Informacin nutricional" en las etiquetas de los alimentos envasados y las bebidas. La cantidad de caloras, carbohidratos, grasas y otros nutrientes mencionados en la etiqueta se basan en una porcin del alimento. Muchos alimentos contienen ms de una porcin por envase.  Verifique la cantidad total de gramos (g) de carbohidratos totales en una porcin. Puede calcular la cantidad de  porciones de carbohidratos al dividir el total de carbohidratos por 15. Por ejemplo, si un alimento tiene un total de 30g de carbohidratos, equivale a 2 porciones de carbohidratos.  Verifique la cantidad de gramos (g) de grasas saturadas y grasas trans en una porcin. Escoja alimentos que no contengan grasa o que tengan un bajo contenido.  Verifique la cantidad de miligramos (mg) de sal (sodio) en una porcin. La mayora de las personas deben limitar la ingesta de sodio total a menos de 2300mg  por Futures traderda.  Siempre consulte la informacin nutricional de los alimentos etiquetados como "con bajo contenido de grasa" o "sin grasa". Estos alimentos pueden tener un mayor contenido de International aid/development workerazcar agregada o carbohidratos refinados, y deben evitarse.  Hable con su nutricionista para identificar sus objetivos diarios en cuanto a los nutrientes mencionados en la etiqueta. Al ir de compras  Evite comprar alimentos procesados, enlatados o precocinados. Estos alimentos tienden a Counselling psychologisttener una mayor cantidad de [place]grasa, sodio y azcar agregada.  Compre en la zona exterior de la tienda de comestibles. Esta zona incluye frutas y verduras frescas, granos a granel, carnes frescas y productos lcteos frescos. Al cocinar  Utilice mtodos de coccin a baja temperatura, como hornear, en lugar de mtodos de coccin a alta temperatura, como frer en abundante aceite.  Cocine con aceites saludables, como el aceite de [place]oliva, canola o [place] Hillsgirasol.  Evite cocinar con manteca, crema o carnes con alto contenido de grasa. Planificacin de las comidas  Coma las comidas y los refrigerios regularmente, preferentemente a la misma  [employer]. Evite pasar largos perodos de tiempo sin comer.  Consuma alimentos ricos en fibra, como frutas frescas, verduras, frijoles y cereales integrales. Consulte a su nutricionista sobre cuntas porciones de carbohidratos puede consumir en cada comida.  Consuma entre 4 y 6 onzas (oz) de protenas  magras por da, como carnes [place], pollo, pescado, huevos o tofu. Una onza de protena magra equivale a: ? 1 onza de carne, pollo o pescado. ? 1huevo. ?  taza de tofu.  Coma algunos alimentos por da que contengan grasas saludables, como aguacates, frutos secos, semillas y pescado. Estilo de vida  Controle su nivel de glucemia con regularidad.  Haga actividad fsica habitualmente como se lo haya indicado el mdico. Esto puede incluir lo siguiente: ? semanales de ejercicio de intensidad moderada o alta. Esto podra incluir caminatas dinmicas, ciclismo o gimnasia acutica. ? Realizar ejercicios de elongacin y de fortalecimiento, como yoga o levantamiento de pesas, por lo menos 2veces por semana.  Tome los [employer] se lo haya indicado el mdico.  No consuma ningn producto que contenga nicotina o tabaco, como cigarrillos y Administrator, Civil Service. Si necesita ayuda para dejar de fumar, consulte al [employer] con un asesor o instructor en diabetes para identificar estrategias para controlar el estrs y cualquier desafo emocional y social. Preguntas para hacerle al mdico  Es necesario que consulte a IT trainer en el cuidado de la diabetes?  Es necesario que me rena con un nutricionista?  A qu nmero puedo llamar si tengo preguntas?  Cules son los mejores momentos para controlar la glucemia? Dnde encontrar ms informacin:  Asociacin Estadounidense de la Diabetes (American Diabetes Association): diabetes.org  Academia de Nutricin y Pension scheme manager (Academy of Nutrition and Dietetics): www.eatright.org  [employer] de la Diabetes y las Enfermedades Digestivas y [place] [facility] of Diabetes and Digestive and Kidney Diseases, NIH): CarFlippers.tn Resumen  Un plan de alimentacin saludable lo ayudar a Scientist, physiological glucemia y Pharmacologist un estilo de vida saludable.  Trabajar con un especialista en dietas y nutricin  (nutricionista) puede ayudarlo a Designer, television/film set de alimentacin para usted.  Tenga en cuenta que los carbohidratos (hidratos de carbono) y el alcohol tienen efectos inmediatos en sus niveles de glucemia. Es importante contar los carbohidratos que ingiere y consumir alcohol con prudencia. Esta informacin no tiene Theme park manager el consejo del mdico. Asegrese de hacerle al mdico cualquier pregunta que tenga. Document Released: 11/21/2007 Document Revised: 04/24/2017 Document Reviewed: 12/04/2016 Elsevier Interactive Patient Education  2019 Elsevier Inc.  Viral Respiratory Infection A viral respiratory infection is an illness that affects parts of the body that are used for breathing. These include the lungs, nose, and throat. It is caused by a germ called a virus. Some examples of this kind of infection are:  A cold.  The flu (influenza).  A respiratory syncytial virus (RSV) infection. A person who gets this illness may have the following symptoms:  A stuffy or runny nose.  Yellow or green fluid in the nose.  A cough.  Sneezing.  Tiredness (fatigue).  Achy muscles.  A sore throat.  Sweating or chills.  A fever.  A headache. Follow these instructions at home: Managing pain and congestion  Take over-the-counter and prescription medicines only as told by your doctor.  If you have a sore throat, gargle with salt water. Do this 3-4 times per day or as needed. To make a salt-water mixture, dissolve -1 tsp of salt in 1 cup of warm  water. Make sure that all the salt dissolves.  Use nose drops made from salt water. This helps with stuffiness (congestion). It also helps soften the skin around your nose.  Drink enough fluid to keep your pee (urine) pale yellow. General instructions   Rest as much as possible.  Do not drink alcohol.  Do not use any products that have nicotine or tobacco, such as cigarettes and e-cigarettes. If you need help quitting, ask  your doctor.  Keep all follow-up visits as told by your doctor. This is important. How is this prevented?   Get a flu shot every year. Ask your doctor when you should get your flu shot.  Do not let other people get your germs. If you are sick: ? Stay home from work or school. ? Wash your hands with soap and water often. Wash your hands after you cough or sneeze. If soap and water are not available, use hand sanitizer.  Avoid contact with people who are sick during cold and flu season. This is in fall and winter. Get help if:  Your symptoms last for 10 days or longer.  Your symptoms get worse over time.  You have a fever.  You have very bad pain in your face or forehead.  Parts of your jaw or neck become very swollen. Get help right away if:  You feel pain or pressure in your chest.  You have shortness of breath.  You faint or feel like you will faint.  You keep throwing up (vomiting).  You feel confused. Summary  A viral respiratory infection is an illness that affects parts of the body that are used for breathing.  Examples of this illness include a cold, the flu, and respiratory syncytial virus (RSV) infection.  The infection can cause a runny nose, cough, sneezing, sore throat, and fever.  Follow what your doctor tells you about taking medicines, drinking lots of fluid, washing your hands, resting at home, and avoiding people who are sick. This information is not intended to replace advice given to you by your health care provider. Make sure you discuss any questions you have with your health care provider. Document Released: 07/27/2008 Document Revised: 09/24/2017 Document Reviewed: 09/24/2017 Elsevier Interactive Patient Education  2019 Elsevier Inc.      Edwina Barth, MD [facility]

## 2018-10-04 LAB — MICROALBUMIN, URINE

## 2018-10-07 ENCOUNTER — Other Ambulatory Visit: Payer: Self-pay | Admitting: Emergency Medicine

## 2018-10-07 DIAGNOSIS — E291 Testicular hypofunction: Secondary | ICD-10-CM

## 2018-10-07 LAB — CBC WITH DIFFERENTIAL/PLATELET
Basophils Absolute: 0 10*3/uL (ref 0.0–0.2)
Basos: 0 %
EOS (ABSOLUTE): 0.1 10*3/uL (ref 0.0–0.4)
EOS: 2 %
HEMATOCRIT: 45.8 % (ref 37.5–51.0)
HEMOGLOBIN: 15.4 g/dL (ref 13.0–17.7)
IMMATURE GRANS (ABS): 0 10*3/uL (ref 0.0–0.1)
IMMATURE GRANULOCYTES: 0 %
LYMPHS: 33 %
Lymphocytes Absolute: 2.4 10*3/uL (ref 0.7–3.1)
MCH: 31 pg (ref 26.6–33.0)
MCHC: 33.6 g/dL (ref 31.5–35.7)
MCV: 92 fL (ref 79–97)
MONOCYTES: 7 %
Monocytes Absolute: 0.5 10*3/uL (ref 0.1–0.9)
Neutrophils Absolute: 4.2 10*3/uL (ref 1.4–7.0)
Neutrophils: 58 %
Platelets: 248 10*3/uL (ref 150–450)
RBC: 4.96 x10E6/uL (ref 4.14–5.80)
RDW: 13.7 % (ref 11.6–15.4)
WBC: 7.3 10*3/uL (ref 3.4–10.8)

## 2018-10-07 LAB — COMPREHENSIVE METABOLIC PANEL
ALK PHOS: 118 IU/L — AB (ref 39–117)
ALT: 57 IU/L — AB (ref 0–44)
AST: 41 IU/L — ABNORMAL HIGH (ref 0–40)
Albumin/Globulin Ratio: 1.8 (ref 1.2–2.2)
Albumin: 4.5 g/dL (ref 3.8–4.8)
BUN / CREAT RATIO: 12 (ref 10–24)
BUN: 11 mg/dL (ref 8–27)
Bilirubin Total: 0.3 mg/dL (ref 0.0–1.2)
CO2: 22 mmol/L (ref 20–29)
CREATININE: 0.89 mg/dL (ref 0.76–1.27)
Calcium: 10.1 mg/dL (ref 8.6–10.2)
Chloride: 102 mmol/L (ref 96–106)
GFR calc Af Amer: 103 mL/min/{1.73_m2} (ref >59)
GFR calc non Af Amer: 89 mL/min/{1.73_m2} (ref >59)
GLUCOSE: 142 mg/dL — AB (ref 65–99)
Globulin, Total: 2.5 g/dL (ref 1.5–4.5)
Potassium: 4.6 mmol/L (ref 3.5–5.2)
Sodium: 140 mmol/L (ref 134–144)
TOTAL PROTEIN: 7 g/dL (ref 6.0–8.5)

## 2018-10-07 LAB — LIPID PANEL
Chol/HDL Ratio: 5 ratio (ref 0.0–5.0)
Cholesterol, Total: 164 mg/dL (ref 100–199)
HDL: 33 mg/dL — AB (ref >39)
LDL Calculated: 82 mg/dL (ref 0–99)
Triglycerides: 247 mg/dL — ABNORMAL HIGH (ref 0–149)
VLDL Cholesterol Cal: 49 mg/dL — ABNORMAL HIGH (ref 5–40)

## 2018-10-07 LAB — TESTT+TESTF+SHBG
SEX HORMONE BINDING: 31.9 nmol/L (ref 19.3–76.4)
TESTOSTERONE FREE: 4.4 pg/mL — AB (ref 6.6–18.1)
Testosterone, Total, LC/MS: 237.9 ng/dL — ABNORMAL LOW (ref 264.0–916.0)

## 2018-10-24 ENCOUNTER — Ambulatory Visit: Payer: Medicare Other | Admitting: Family Medicine

## 2018-10-24 ENCOUNTER — Encounter

## 2018-10-30 ENCOUNTER — Encounter: Payer: Self-pay | Admitting: Internal Medicine

## 2018-10-31 ENCOUNTER — Ambulatory Visit (INDEPENDENT_AMBULATORY_CARE_PROVIDER_SITE_OTHER): Payer: Medicare Other | Admitting: Emergency Medicine

## 2018-10-31 ENCOUNTER — Telehealth: Payer: Self-pay | Admitting: Emergency Medicine

## 2018-10-31 ENCOUNTER — Encounter: Payer: Self-pay | Admitting: Emergency Medicine

## 2018-10-31 ENCOUNTER — Other Ambulatory Visit: Payer: Self-pay

## 2018-10-31 VITALS — BP 134/71 | HR 74 | Temp 98.3°F | Resp 16 | Ht 62.25 in | Wt 190.8 lb

## 2018-10-31 DIAGNOSIS — J22 Unspecified acute lower respiratory infection: Secondary | ICD-10-CM | POA: Diagnosis not present

## 2018-10-31 DIAGNOSIS — R05 Cough: Secondary | ICD-10-CM

## 2018-10-31 DIAGNOSIS — R52 Pain, unspecified: Secondary | ICD-10-CM

## 2018-10-31 DIAGNOSIS — E119 Type 2 diabetes mellitus without complications: Secondary | ICD-10-CM | POA: Diagnosis not present

## 2018-10-31 DIAGNOSIS — R059 Cough, unspecified: Secondary | ICD-10-CM

## 2018-10-31 LAB — POCT INFLUENZA A/B
INFLUENZA B, POC: NEGATIVE
Influenza A, POC: NEGATIVE

## 2018-10-31 MED ORDER — BENZONATATE 200 MG PO CAPS: 200.0000 mg | ORAL_CAPSULE | Freq: Two times a day (BID) | ORAL | 0 refills | Status: DC | PRN

## 2018-10-31 MED ORDER — AZITHROMYCIN 250 MG PO TABS: ORAL_TABLET | ORAL | 0 refills | Status: DC

## 2018-10-31 MED ORDER — HYDROCODONE-HOMATROPINE 5-1.5 MG/5ML PO SYRP: 5.0000 mL | ORAL_SOLUTION | Freq: Every evening | ORAL | 0 refills | Status: DC | PRN

## 2018-10-31 NOTE — Telephone Encounter (Signed)
Copied from CRM [id number]. Topic: Quick Communication - See Telephone Encounter >> Oct 31, 2018 12:40 PM Lorrine Kin, NT wrote: CRM for notification. See Telephone encounter for: 10/31/18. [facility] pharmacy calling to get clarification on the azithromycin (ZITHROMAX) 250 MG tablet that was sent in today. Would like to know if Dr Alvy Bimler is wanting the zpack or if he wants individual pills? If it is individual pills, will need dosage instructions. Please advise.  CB#: [phone]

## 2018-10-31 NOTE — Progress Notes (Signed)
Patrick Park 68 y.o.   Chief Complaint  Patient presents with  . Cough    3-4 days productive with green mucus  . Generalized Body Aches    per patient body pain    HISTORY OF PRESENT ILLNESS: This is a 68 y.o. male complaining of flulike symptoms for the past 3 to 4 days with productive cough and generalized body aches.  Family members have also been sick with similar symptoms. Also complaining of bilateral conjunctivitis and headaches. Cough  Associated symptoms include eye redness, a fever, headaches and myalgias. Pertinent negatives include no chest pain, rash or shortness of breath.     Prior to Admission medications   Medication Sig Start Date End Date Taking? Authorizing Provider  aspirin EC 81 MG tablet Take 81 mg by mouth daily.   Yes [provider]  Fish Oil OIL Take 1 capsule by mouth daily.   Yes [provider]  glipiZIDE (GLUCOTROL) 5 MG tablet Take 1 tablet (5 mg total) by mouth daily before breakfast. 06/07/18  Yes Tipton Ballow, [name], MD  ibuprofen (ADVIL,MOTRIN) 600 MG tablet Take 1 tablet (600 mg total) by mouth every 6 (six) hours as needed. 11/03/14  Yes Kirichenko, Tatyana, PA-C  Lancets MISC OneTouch Delica Lancets 33 gauge  check glucose levels 2-3 times a day DX: E11.65, NPI [id number], Date: 03/21/17   Yes [provider]  metFORMIN (GLUCOPHAGE) 500 MG tablet Take 1 tablet (500 mg total) by mouth 2 (two) times daily with a meal. 06/07/18  Yes Fleetwood Pierron, [name], MD  Multiple Vitamin (MULTIVITAMIN) tablet Take 1 tablet by mouth daily.   Yes [provider]  naproxen (NAPROSYN) 500 MG tablet Take 500 mg by mouth daily.   Yes [provider]  omeprazole (PRILOSEC) 20 MG capsule Take 20 mg by mouth daily.   Yes [provider]  pravastatin (PRAVACHOL) 20 MG tablet Take 20 mg by mouth daily.   Yes [provider]  albuterol (PROVENTIL HFA;VENTOLIN HFA) 108 (90 BASE) MCG/ACT inhaler Inhale 2  puffs into the lungs every 4 (four) hours as needed for wheezing or shortness of breath. Patient not taking: Reported on 10/31/2018 11/03/14   Jaynie Crumble, PA-C  fluticasone [facility]) 50 MCG/ACT nasal spray  02/06/18   [provider]  glucose blood test strip 1 each by Other route as directed. 10/03/18   [name], MD  lisinopril (PRINIVIL,ZESTRIL) 10 MG tablet Take 1 tablet (10 mg total) by mouth daily. 06/07/18 09/05/18  [name], MD    No Known Allergies  Patient Active Problem List   Diagnosis Date Noted  . Essential hypertension 10/03/2018  . Type 2 diabetes mellitus with hyperglycemia, without long-term current use of insulin (HCC) 10/03/2018  . Dyslipidemia 10/03/2018  . General weakness 10/03/2018    Past Medical History:  Diagnosis Date  . Allergy   . Benign paroxysmal positional vertigo   . Diabetes mellitus without complication (HCC)   . GERD (gastroesophageal reflux disease)   . Hyperlipidemia   . Hypertension     Past Surgical History:  Procedure Laterality Date  . COLONOSCOPY     11-12 yrs ago- normal per pt-pt doesnt remeber where he had the colonoscopy   . HERNIA REPAIR      Social History   Socioeconomic History  . Marital status: Married    Spouse name: Not on file  . Number of children: Not on file  . Years of education: Not on file  . Highest  education level: Not on file  Occupational History  . Not on file  Social Needs  . Financial resource strain: Not on file  . Food insecurity:    Worry: Not on file    Inability: Not on file  . Transportation needs:    Medical: Not on file    Non-medical: Not on file  Tobacco Use  . Smoking status: Current Some Day Smoker  . Smokeless tobacco: Never Used  . Tobacco comment: 1- zero a day   Substance and Sexual Activity  . Alcohol use: No  . Drug use: No  . Sexual activity: Not on file  Lifestyle  . Physical activity:    Days per week: Not on file    Minutes per  session: Not on file  . Stress: Not on file  Relationships  . Social connections:    Talks on phone: Not on file    Gets together: Not on file    Attends religious service: Not on file    Active member of club or organization: Not on file    Attends meetings of clubs or organizations: Not on file    Relationship status: Not on file  . Intimate partner violence:    Fear of current or ex partner: Not on file    Emotionally abused: Not on file    Physically abused: Not on file    Forced sexual activity: Not on file  Other Topics Concern  . Not on file  Social History Narrative  . Not on file    Family History  Problem Relation Age of Onset  . Colon cancer Neg Hx   . Colon polyps Neg Hx   . Esophageal cancer Neg Hx   . Rectal cancer Neg Hx   . Stomach cancer Neg Hx      Review of Systems  Constitutional: Positive for fever.  HENT: Positive for congestion.   Eyes: Positive for discharge and redness.  Respiratory: Positive for cough. Negative for shortness of breath.   Cardiovascular: Negative.  Negative for chest pain and palpitations.  Gastrointestinal: Negative.  Negative for abdominal pain, diarrhea, nausea and vomiting.  Genitourinary: Negative.   Musculoskeletal: Positive for myalgias.  Skin: Negative.  Negative for rash.  Neurological: Positive for headaches.  All other systems reviewed and are negative.  Vitals:   10/31/18 1122  BP: 134/71  Pulse: 74  Resp: 16  Temp: 98.3 F (36.8 C)  SpO2: 96%     Physical Exam Vitals signs reviewed.  Constitutional:      Appearance: Normal appearance.  HENT:     Head: Normocephalic and atraumatic.     Right Ear: Tympanic membrane, ear canal and external ear normal.     Left Ear: Tympanic membrane, ear canal and external ear normal.     Nose: Congestion present.     Mouth/Throat:     Mouth: Mucous membranes are moist.     Pharynx: Oropharynx is clear.  Eyes:     Extraocular Movements: Extraocular movements  intact.     Conjunctiva/sclera:     Right eye: Right conjunctiva is injected.     Left eye: Left conjunctiva is injected.     Pupils: Pupils are equal, round, and reactive to light.  Neck:     Musculoskeletal: Normal range of motion and neck supple.  Cardiovascular:     Rate and Rhythm: Normal rate and regular rhythm.     Heart sounds: Normal heart sounds.  Pulmonary:     Effort:  Pulmonary effort is normal.     Breath sounds: Normal breath sounds.  Musculoskeletal: Normal range of motion.  Lymphadenopathy:     Cervical: No cervical adenopathy.  Skin:    General: Skin is warm and dry.     Capillary Refill: Capillary refill takes less than 2 seconds.  Neurological:     General: No focal deficit present.     Mental Status: He is alert and oriented to person, place, and time.  Psychiatric:        Mood and Affect: Mood normal.        Behavior: Behavior normal.    Results for orders placed or performed in visit on 10/31/18 (from the past 24 hour(s))  POCT Influenza A/B     Status: None   Collection Time: 10/31/18 12:10 PM  Result Value Ref Range   Influenza A, POC Negative Negative   Influenza B, POC Negative Negative       ASSESSMENT & PLAN: Xsavior was seen today for cough and generalized body aches.  Diagnoses and all orders for this visit:  Cough -     Cancel: POCT Influenza A/B -     POCT Influenza A/B -     benzonatate (TESSALON) 200 MG capsule; Take 1 capsule (200 mg total) by mouth 2 (two) times daily as needed for cough. -     HYDROcodone-homatropine (HYCODAN) 5-1.5 MG/5ML syrup; Take 5 mLs by mouth at bedtime as needed for cough.  Generalized body aches -     Cancel: POCT Influenza A/B -     POCT Influenza A/B  Type 2 diabetes mellitus without complication, without long-term current use of insulin (HCC) -     HM Diabetes Foot Exam  Lower respiratory infection -     azithromycin (ZITHROMAX) 250 MG tablet; Sig as indicated    Patient Instructions        If you have lab work done today you will be contacted with your lab results within the next 2 weeks.  If you have not heard from [place] then please contact us. The fastest way to get your results is to register for My Chart.   IF you received an x-ray today, you will receive an invoice from [facility]. Please contact [facility] at [phone] with questions or concerns regarding your invoice.   IF you received labwork today, you will receive an invoice from [facility]. Please contact [facility] at [phone] with questions or concerns regarding your invoice.   Our billing staff will not be able to assist you with questions regarding bills from these companies.  You will be contacted with the lab results as soon as they are available. The fastest way to get your results is to activate your My Chart account. Instructions are located on the last page of this paperwork. If you have not heard from [place] regarding the results in 2 weeks, please contact this office.     Tos en los adultos (Cough, Adult) La tos ayuda a limpiar la garganta y los pulmones. La tos puede durar solo 2 o 3semanas (aguda) o ms de 8semanas (crnica). Las causas de la tos son [place]. Puede ser el signo de [place] enfermedad o de otro trastorno. CUIDADOS EN EL HOGAR  Est atento a cualquier cambio en la tos.  Tome los medicamentos solamente como se lo haya indicado el mdico. ? Si le recetaron un antibitico, tmelo como se lo haya indicado el mdico. No deje de tomarlo aunque comience a sentirse mejor. ?  Hable con el mdico antes de probar un medicamento para la tos.  Beba suficiente lquido para mantener el pis (orina) claro o de color amarillo plido.  Si el aire est seco, use un vaporizador o un humidificador con vapor fro en su casa.  Mantngase alejado de las cosas que lo hacen toser en el trabajo o en su casa.  Si la tos aumenta durante la noche, haga la prueba de usar almohadas  adicionales para Pharmacologist la cabeza ms elevada mientras duerme.  No fume e intente no estar cerca de humo. Si necesita ayuda para dejar de fumar, consulte al mdico.  No consuma cafena.  No beba alcohol.  Descanse todo lo que sea necesario. SOLICITE AYUDA SI:  Le aparecen problemas (sntomas) nuevos.  Expectora un lquido amarillento (pus) cuando tose.  La tos no mejora despus de 2 o 3semanas, o empeora.  Los medicamentos no [employer] tos, y no Stage manager.  Siente un dolor que se vuelve ms intenso o que no se [employer].  Tiene fiebre.  Est bajando de peso y no sabe por qu.  Tiene transpiracin nocturna. SOLICITE AYUDA DE INMEDIATO SI:  Tose y escupe sangre.  Tiene dificultad para respirar.  Los latidos cardacos son muy rpidos. Esta informacin no tiene Theme park manager el consejo del mdico. Asegrese de hacerle al mdico cualquier pregunta que tenga. Document Released: 04/27/2011 Document Revised: 05/05/2015 Document Reviewed: 10/21/2014 Elsevier Interactive Patient Education  2019 Elsevier Inc.      Edwina Barth, MD [facility]

## 2018-10-31 NOTE — Patient Instructions (Addendum)
     If you have lab work done today you will be contacted with your lab results within the next 2 weeks.  If you have not heard from [place] then please contact us. The fastest way to get your results is to register for My Chart.   IF you received an x-ray today, you will receive an invoice from [facility]. Please contact [facility] at [phone] with questions or concerns regarding your invoice.   IF you received labwork today, you will receive an invoice from [facility]. Please contact [facility] at [phone] with questions or concerns regarding your invoice.   Our billing staff will not be able to assist you with questions regarding bills from these companies.  You will be contacted with the lab results as soon as they are available. The fastest way to get your results is to activate your My Chart account. Instructions are located on the last page of this paperwork. If you have not heard from [place] regarding the results in 2 weeks, please contact this office.     Tos en los adultos (Cough, Adult) La tos ayuda a limpiar la garganta y los pulmones. La tos puede durar solo 2 o 3semanas (aguda) o ms de 8semanas (crnica). Las causas de la tos son [place]. Puede ser el signo de [place] enfermedad o de otro trastorno. CUIDADOS EN EL HOGAR  Est atento a cualquier cambio en la tos.  Tome los medicamentos solamente como se lo haya indicado el mdico. ? Si le recetaron un antibitico, tmelo como se lo haya indicado el mdico. No deje de tomarlo aunque comience a sentirse mejor. ? Hable con el mdico antes de probar un medicamento para la tos.  Beba suficiente lquido para mantener el pis (orina) claro o de color amarillo plido.  Si el aire est seco, use un vaporizador o un humidificador con vapor fro en su casa.  Mantngase alejado de las cosas que lo hacen toser en el trabajo o en su casa.  Si la tos aumenta durante la noche, haga la prueba de usar almohadas adicionales  para Pharmacologist la cabeza ms elevada mientras duerme.  No fume e intente no estar cerca de humo. Si necesita ayuda para dejar de fumar, consulte al mdico.  No consuma cafena.  No beba alcohol.  Descanse todo lo que sea necesario. SOLICITE AYUDA SI:  Le aparecen problemas (sntomas) nuevos.  Expectora un lquido amarillento (pus) cuando tose.  La tos no mejora despus de 2 o 3semanas, o empeora.  Los medicamentos no [employer] tos, y no Stage manager.  Siente un dolor que se vuelve ms intenso o que no se [employer].  Tiene fiebre.  Est bajando de peso y no sabe por qu.  Tiene transpiracin nocturna. SOLICITE AYUDA DE INMEDIATO SI:  Tose y escupe sangre.  Tiene dificultad para respirar.  Los latidos cardacos son muy rpidos. Esta informacin no tiene Theme park manager el consejo del mdico. Asegrese de hacerle al mdico cualquier pregunta que tenga. Document Released: 04/27/2011 Document Revised: 05/05/2015 Document Reviewed: 10/21/2014 Elsevier Interactive Patient Education  2019 [employer].

## 2018-11-01 NOTE — Telephone Encounter (Signed)
Please confirm RX.

## 2018-11-01 NOTE — Telephone Encounter (Signed)
He's supposed to get the Z-pak. Thanks.

## 2018-11-01 NOTE — Telephone Encounter (Signed)
Please advise 

## 2018-11-04 NOTE — Telephone Encounter (Signed)
Called pharmacy to clarify Rx and pharmacist states the did sell him a Z-Pak. I verbalized understanding.

## 2018-11-29 ENCOUNTER — Ambulatory Visit: Payer: Medicare Other | Admitting: Pulmonary Disease

## 2018-12-11 ENCOUNTER — Ambulatory Visit: Payer: Medicare Other | Admitting: Family Medicine

## 2019-01-01 ENCOUNTER — Ambulatory Visit: Payer: Medicare Other | Admitting: Emergency Medicine

## 2019-01-06 ENCOUNTER — Ambulatory Visit (INDEPENDENT_AMBULATORY_CARE_PROVIDER_SITE_OTHER): Payer: Medicare Other | Admitting: Family Medicine

## 2019-01-06 ENCOUNTER — Encounter: Payer: Self-pay | Admitting: Family Medicine

## 2019-01-06 ENCOUNTER — Other Ambulatory Visit: Payer: Self-pay

## 2019-01-06 DIAGNOSIS — H109 Unspecified conjunctivitis: Secondary | ICD-10-CM | POA: Diagnosis not present

## 2019-01-06 DIAGNOSIS — J302 Other seasonal allergic rhinitis: Secondary | ICD-10-CM

## 2019-01-06 MED ORDER — FLUTICASONE PROPIONATE 50 MCG/ACT NA SUSP: 2.0000 | Freq: Every day | NASAL | 2 refills | Status: DC

## 2019-01-06 MED ORDER — ERYTHROMYCIN 5 MG/GM OP OINT: 1.0000 "application " | TOPICAL_OINTMENT | Freq: Every day | OPHTHALMIC | 0 refills | Status: AC

## 2019-01-06 MED ORDER — CETIRIZINE HCL 10 MG PO TABS: 10.0000 mg | ORAL_TABLET | Freq: Every day | ORAL | 2 refills | Status: DC

## 2019-01-06 NOTE — Progress Notes (Deleted)
Called patient to initiate their telephone visit with provider [name], FNP-C. Verified date of birth. Patient has c/o seasonal allergies. Was prescribed Zyrtec but states that it isn't helping his symptoms(runny nose, itchy watery eyes, cough). Also needs refill on flonase. KWalker, CMA.

## 2019-01-06 NOTE — Progress Notes (Signed)
Virtual Visit via Telephone Note  I connected with Patrick Park on 01/06/19 at  3:10 PM EDT by telephone and verified that I am speaking with the correct person using two identifiers.  Location: Patient: Located at home during today's encounter. Provider: Located at primary care office.    I discussed the limitations, risks, security and privacy concerns of performing an evaluation and management service by telephone and the availability of in person appointments. I also discussed with the patient that there may be a patient responsible charge related to this service. The patient expressed understanding and agreed to proceed.  Interpreter Service Used History of Present Illness: Establish care new patient.  Patrick Park has Essential hypertension; Type 2 diabetes mellitus with hyperglycemia, without long-term current use of insulin (HCC); Dyslipidemia; and General weakness on their problem list.  Seasonal allergies  History of chronic allergies which occur during the spring. He is sensitive to pollen. Symptoms include nasal congestion, nasal drainage, itching of eyes. He was previously prescribed Cetrizine and Flonase which he is currently out of both.  He is concerned about right eye redness,itching, and burning. Eye symptoms developed a few days ago. He is not experiencing any visual disturbances. He purchased an over the counter eye lubricant which has not resolved symptoms. Endorses crusty discharge in eye up awakening. No visual disturbances or history of eye disease. No known sick contacts. Denies fever, shortness of breath, weakness,or body aches.  Assessment and Plan: 1. Seasonal allergies, active symptoms -Cetrizine 10 mg at bedtime -Flonase 2 sprays per nares daily regardless if symptoms are present.  2. Conjunctivitis of right eye, unspecified conjunctivitis type Given limitation of telephone encounter, advised patient I will treat for conjunctivitis. He was given strict  precautions to follow-up at ER or urgent care if eye symptoms worsen or does not initially improve with prescribed treatment. Patient verbalized understanding.  -Trial erythromycin ointment 1 band at bedtime     Follow Up Instructions: Schedule follow-up for 2 to 3 weeks for evaluation of diabetes and hyperlipidemia   I discussed the assessment and treatment plan with the patient. The patient was provided an opportunity to ask questions and all were answered. The patient agreed with the plan and demonstrated an understanding of the instructions.   The patient was advised to call back or seek an in-person evaluation if the symptoms worsen or if the condition fails to improve as anticipated.  I provided 25 minutes of non-face-to-face time during this encounter.   [name], FNP

## 2019-01-16 ENCOUNTER — Other Ambulatory Visit: Payer: Self-pay

## 2019-01-16 ENCOUNTER — Ambulatory Visit (INDEPENDENT_AMBULATORY_CARE_PROVIDER_SITE_OTHER): Payer: Medicare Other | Admitting: Family Medicine

## 2019-01-16 DIAGNOSIS — Z79899 Other long term (current) drug therapy: Secondary | ICD-10-CM | POA: Diagnosis not present

## 2019-01-16 DIAGNOSIS — R140 Abdominal distension (gaseous): Secondary | ICD-10-CM | POA: Diagnosis not present

## 2019-01-16 MED ORDER — FAMOTIDINE 40 MG PO TABS: 40.0000 mg | ORAL_TABLET | Freq: Every day | ORAL | 0 refills | Status: DC

## 2019-01-16 NOTE — Progress Notes (Signed)
Virtual Visit via Telephone Note  I connected with Patrick Park on 01/16/19 at  2:30 PM EDT by telephone and verified that I am speaking with the correct person using two identifiers.  Location: Patient: Located at home during today's encounter  Provider: Located at primary care office  Interpreter: Blossom Hoops [phone]    I discussed the limitations, risks, security and privacy concerns of performing an evaluation and management service by telephone and the availability of in person appointments. I also discussed with the patient that there may be a patient responsible charge related to this service. The patient expressed understanding and agreed to proceed.   History of Present Illness: Recently seen at a urgent care and [place] and diagnosed with a sinus infection.  He was prescribed clarithromycin and Singulair for treatment.  He notes since he is been taking the clarithromycin he has had some abdominal pain and mostly bloating and discomfort. The medication is written to be taken twice daily. He is taking the medication 6-7 hours apart. He reports of abdominal pain has subsided however he feels very bloated. He also not always taking medication with food. Denies fever, headache, body aches, or cough. Sinus pressure and congestion have improved.  Assessment and Plan: 1. Abdominal bloating -Trial famotidine 40 mg once daily for abdominal bloating.  2. Medication dosing interval changed -Educated to take Biaxin every 12 hours with food.  Complete all medication.  Follow Up Instructions: 1 week for diabetes follow-up   I discussed the assessment and treatment plan with the patient. The patient was provided an opportunity to ask questions and all were answered. The patient agreed with the plan and demonstrated an understanding of the instructions.   The patient was advised to call back or seek an in-person evaluation if the symptoms worsen or if the condition fails to improve as  anticipated.  I provided 15 minutes of non-face-to-face time during this encounter.   [name], FNP

## 2019-01-16 NOTE — Progress Notes (Deleted)
Called patient to initiate their telephone visit with provider [name], FNP-C. Verified date of birth. Used [employer](Alejandro [phone]) Patient seen at another office & started on Biaxin for sinus infection. He states since starting antibiotic he is having stomach pain, nausea & diarrhea.Marland Kitchen KWalker, CMA.

## 2019-01-23 ENCOUNTER — Ambulatory Visit: Payer: Self-pay | Admitting: Family Medicine

## 2019-01-23 ENCOUNTER — Encounter: Payer: Self-pay | Admitting: Family Medicine

## 2019-01-23 ENCOUNTER — Other Ambulatory Visit: Payer: Self-pay

## 2019-01-23 ENCOUNTER — Ambulatory Visit (INDEPENDENT_AMBULATORY_CARE_PROVIDER_SITE_OTHER): Payer: Medicare Other | Admitting: Family Medicine

## 2019-01-23 VITALS — BP 170/80 | HR 73 | Temp 98.1°F | Resp 17 | Ht 62.0 in | Wt 183.6 lb

## 2019-01-23 DIAGNOSIS — E782 Mixed hyperlipidemia: Secondary | ICD-10-CM | POA: Diagnosis not present

## 2019-01-23 DIAGNOSIS — Z1389 Encounter for screening for other disorder: Secondary | ICD-10-CM | POA: Diagnosis not present

## 2019-01-23 DIAGNOSIS — Z125 Encounter for screening for malignant neoplasm of prostate: Secondary | ICD-10-CM

## 2019-01-23 DIAGNOSIS — I10 Essential (primary) hypertension: Secondary | ICD-10-CM | POA: Diagnosis not present

## 2019-01-23 DIAGNOSIS — K5900 Constipation, unspecified: Secondary | ICD-10-CM | POA: Diagnosis not present

## 2019-01-23 DIAGNOSIS — E119 Type 2 diabetes mellitus without complications: Secondary | ICD-10-CM | POA: Diagnosis not present

## 2019-01-23 DIAGNOSIS — Z130 Encounter for screening for diseases of the blood and blood-forming organs and certain disorders involving the immune mechanism: Secondary | ICD-10-CM

## 2019-01-23 LAB — POCT URINALYSIS DIP (CLINITEK)
Bilirubin, UA: NEGATIVE
Blood, UA: NEGATIVE
Glucose, UA: NEGATIVE mg/dL
Ketones, POC UA: NEGATIVE mg/dL
Leukocytes, UA: NEGATIVE
Nitrite, UA: NEGATIVE
POC PROTEIN,UA: NEGATIVE
Spec Grav, UA: <=1.005 — AB (ref 1.010–1.025)
Urobilinogen, UA: 0.2 E.U./dL
pH, UA: 7 (ref 5.0–8.0)

## 2019-01-23 MED ORDER — MONTELUKAST SODIUM 10 MG PO TABS: 10.0000 mg | ORAL_TABLET | Freq: Every day | ORAL | 2 refills | Status: DC

## 2019-01-23 MED ORDER — GLIPIZIDE 5 MG PO TABS: 5.0000 mg | ORAL_TABLET | Freq: Every day | ORAL | 3 refills | Status: DC

## 2019-01-23 MED ORDER — DOCUSATE SODIUM 50 MG PO CAPS: 50.0000 mg | ORAL_CAPSULE | Freq: Every day | ORAL | 0 refills | Status: DC | PRN

## 2019-01-23 MED ORDER — LISINOPRIL 20 MG PO TABS: 20.0000 mg | ORAL_TABLET | Freq: Every day | ORAL | 2 refills | Status: DC

## 2019-01-23 MED ORDER — FAMOTIDINE 40 MG PO TABS: 40.0000 mg | ORAL_TABLET | Freq: Every day | ORAL | 0 refills | Status: DC

## 2019-01-23 MED ORDER — FLUTICASONE PROPIONATE 50 MCG/ACT NA SUSP: 2.0000 | Freq: Every day | NASAL | 2 refills | Status: DC

## 2019-01-23 MED ORDER — ATORVASTATIN CALCIUM 10 MG PO TABS: 10.0000 mg | ORAL_TABLET | Freq: Every day | ORAL | 2 refills | Status: DC

## 2019-01-23 MED ORDER — METFORMIN HCL 500 MG PO TABS: 500.0000 mg | ORAL_TABLET | Freq: Two times a day (BID) | ORAL | 3 refills | Status: DC

## 2019-01-23 MED ORDER — CETIRIZINE HCL 10 MG PO TABS: 10.0000 mg | ORAL_TABLET | Freq: Every day | ORAL | 2 refills | Status: DC

## 2019-01-23 NOTE — Patient Instructions (Addendum)
To improve sleep, start Melatonin 1 hours prior to bedtime.       Estreimiento, en adultos Constipation, Adult Estreimiento significa que una persona defeca en una semana menos que lo normal, tiene dificultad para defecar, o las heces son secas, duras, o ms grandes que lo normal. El estreimiento podra estar provocado por una enfermedad preexistente. Puede empeorar con la edad si una persona toma ciertos medicamentos y no toma suficiente lquido. Siga estas indicaciones en su casa: Qu debe comer y beber   Consuma alimentos con alto contenido de [place], como frutas y verduras frescas, cereales integrales y frijoles.  Limite los alimentos ricos en grasas y con bajo contenido de [place], o muy procesados, como las papas fritas, [place], [place], dulces y refrescos.  Beba suficiente lquido para Photographer orina clara o de color amarillo plido. Instrucciones generales  Haga actividad fsica habitualmente o como se lo haya indicado el mdico.  Vaya al bao cuando sienta la necesidad de ir. No se aguante las ganas.  Tome los medicamentos de venta libre y los recetados solamente como se lo haya indicado el mdico. Estos incluyen los suplementos de [place].  Practique ejercicios de rehabilitacin del suelo plvico, como la respiracin profunda mientras relaja la parte inferior del abdomen y la relajacin del suelo plvico mientras defeca.  Controle su afeccin para Insurance risk surveyor cambio.  Concurra a todas las visitas de [address] se lo haya indicado el mdico. Esto es importante. Comunquese con un mdico si:  Su dolor empeora.  Tiene fiebre.  No defeca despus de 4das.  Vomita.  No tiene hambre.  Pierde peso.  Tiene una hemorragia en el ano.  Las heces son delgadas como un lpiz. Solicite ayuda de inmediato si:  Tiene fiebre y los sntomas empeoran repentinamente.  Observa que se filtran heces o hay sangre en las heces.  Tiene el abdomen  distendido.  Siente un dolor intenso en el abdomen.  Se siente mareado o se desmaya. Esta informacin no tiene Theme park manager el consejo del mdico. Asegrese de hacerle al mdico cualquier pregunta que tenga. Document Released: 09/03/2007 Document Revised: 12/04/2016 Document Reviewed: 02/02/2016 Elsevier Interactive Patient Education  2019 Elsevier Inc.  Insomnio Insomnia El insomnio es un trastorno del sueo que causa dificultades para conciliar el sueo o para [place]. Puede producir fatiga, falta de energa, dificultad para concentrarse, cambios en el estado de nimo y mal rendimiento escolar o laboral. Hay tres formas diferentes de clasificar el insomnio:  Dificultad para conciliar el sueo.  Dificultad para mantener el sueo.  Despertar muy precoz por la maana. Cualquier tipo de insomnio puede ser a Air cabin crew (crnico) o a Product manager (agudo). Ambos son frecuentes. Generalmente, el insomnio a corto plazo dura tres meses o menos tiempo. El crnico ocurre al menos tres veces por semana durante ms de tres meses. Cules son las causas? El insomnio puede deberse a otra afeccin, situacin o sustancia, por ejemplo:  Ansiedad.  Ciertos medicamentos.  Enfermedad de reflujo gastroesofgico (ERGE) u otras enfermedades gastrointestinales.  Asma y otras enfermedades respiratorias.  Sndrome de las piernas inquietas, apnea del sueo u otros trastornos del sueo.  Dolor crnico.  Menopausia.  Accidente cerebrovascular.  Consumo excesivo de alcohol, tabaco u drogas ilegales.  Afecciones de salud mental, como depresin.  Cafena.  Trastornos neurolgicos, como enfermedad de Alzheimer.  Hiperactividad de la glndula tiroidea (hipertiroidismo). En ocasiones, la causa del insomnio puede ser desconocida. Qu incrementa el riesgo? Los factores de riesgo de tener insomnio incluyen lo  siguiente:  Sexo. La enfermedad afecta ms a menudo a las mujeres que a los  hombres.  Edad. El insomnio es ms frecuente a medida que una persona envejece.  Estrs.  La falta de actividad fsica.  Los horarios de trabajo irregulares o los turnos nocturnos.  Los viajes a lugares de diferentes zonas horarias.  Ciertas afecciones mdicas y de salud mental. Cules son los signos o los sntomas? Si tiene insomnio, el sntoma principal es la dificultad para conciliar el sueo o mantenerlo. Esto puede derivar en otros sntomas, por ejemplo:  Sentirse fatigado o tener poca energa.  Ponerse nervioso por [employer] irse a dormir.  No sentirse descansado por la maana.  Tener dificultad para concentrarse.  Sentirse irritable, ansioso o deprimido. Cmo se diagnostica? Esta afeccin se puede diagnosticar en funcin de lo siguiente:  Los sntomas y antecedentes mdicos. El mdico puede hacerle preguntas sobre: ? Hbitos de sueo. ? Cualquier afeccin mdica que tenga. ? La salud mental.  Un examen fsico. Cmo se trata? El tratamiento para el insomnio depende de la causa. El tratamiento puede centrarse en tratar Neomia Dear afeccin preexistente que causa el insomnio. El tratamiento tambin puede incluir lo siguiente:  Medicamentos que lo ayuden a dormir.  Asesoramiento psicolgico o terapia.  Ajustes en el estilo de vida para ayudarlo a dormir mejor. Siga estas indicaciones en su casa: Comida y bebida   Limite o evite el consumo de alcohol, bebidas con cafena y cigarrillos, especialmente cerca de la hora de [place], ya que pueden perturbarle el sueo.  No consuma una comida suculenta ni coma alimentos condimentados justo antes de la hora de [place]. Esto puede causarle molestias digestivas y dificultades para dormir. Hbitos de sueo   Lleve un registro del sueo ya que podra ser de utilidad para que usted y a su mdico puedan determinar qu podra estar causndole insomnio. Escriba los siguientes datos: ? Cundo duerme. ? Cundo se despierta  durante la noche. ? Qu tan bien duerme. ? Qu tan relajado se siente al da siguiente. ? Cualquier efecto secundario de los [employer] toma. ? Lo que usted come y bebe.  Convierta su habitacin en un lugar oscuro, cmodo donde sea fcil conciliar el sueo. ? Coloque persianas o cortinas oscuras que impidan la entrada de la luz del exterior. ? Para bloquear los ruidos, use un aparato que reproduzca sonidos ambientales o relajantes de fondo. ? Mantenga baja la temperatura.  Limite el uso de pantallas antes de la hora de [place]. Esto incluye lo siguiente: ? Mirar televisin. ? Usar el telfono inteligente, la tableta o la computadora.  Siga una rutina que incluya ir a dormir y Chiropodist a la misma hora cada da y noche. Esto puede ayudarlo a conciliar el sueo ms rpidamente. Considere realizar [employer] tranquila, como leer, e incorporarla como parte de la rutina a la hora de irse a dormir.  Trate de evitar tomar siestas durante el da para que pueda dormir mejor por la noche.  Levntese de la cama si sigue despierto despus de de haber intentado dormirse. Mantenga bajas las luces, pero intente leer o hacer una actividad tranquila. Cuando tenga sueo, regrese a Pharmacist, hospital. Instrucciones generales  [employer] de venta libre y los recetados solamente como se lo haya indicado el mdico.  Realice ejercicio con regularidad como se lo haya indicado el mdico. Evite la actividad fsica desde varias horas antes de irse a dormir.  Utilice tcnicas de relajacin para controlar el estrs. Pdale al  mdico que le sugiera algunas tcnicas que sean adecuadas para usted. Estos pueden incluir lo siguiente: ? Ejercicios de respiracin. ? Rutinas para aliviar la tensin muscular. ? Visualizacin de escenas apacibles.  Conduzca con cuidado. No conduzca si est muy somnoliento.  Concurra a todas las visitas de control como se lo haya indicado el mdico. Esto es  importante. Comunquese con un mdico si:  Est cansado durante todo Medical laboratory scientific officerel da.  Tiene dificultad en su rutina diaria debido a la somnolencia.  Sigue teniendo problemas para dormir o Teacher, English as a foreign languageestos empeoran. Solicite ayuda de inmediato si:  Piensa seriamente en lastimarse a usted mismo o a Engineer, maintenance (IT)otra persona. Si alguna vez siente que puede lastimarse a usted mismo o a Economistotras personas, o tiene pensamientos de poner fin a su vida, busque ayuda de inmediato. Puede dirigirse al servicio de emergencias ms cercano o comunicarse con:  El servicio de emergencias de su localidad (911 en [place]  Una lnea de asistencia al suicida y Visual merchandiseratencin en crisis, como la Murphy OilLnea Nacional de Prevencin del Suicidio (National Suicide Prevention Lifeline), al [phone]. Est disponible las 24 horas del da. Resumen  El insomnio es un trastorno del sueo que causa dificultades para conciliar el sueo o para Wallacemantenerlo.  El insomnio puede ser a Air cabin crewlargo plazo (crnico) o a Product managercorto plazo (agudo).  El tratamiento para el insomnio depende de la causa. El tratamiento puede centrarse en tratar Neomia Dearuna afeccin preexistente que causa el insomnio.  Lleve un registro del sueo ya que podra ser de utilidad para que usted y a su mdico puedan determinar qu podra estar causndole insomnio. Esta informacin no tiene Theme park managercomo fin reemplazar el consejo del mdico. Asegrese de hacerle al mdico cualquier pregunta que tenga. Document Released: 08/14/2005 Document Revised: 08/03/2017 Document Reviewed: 08/03/2017 Elsevier Interactive Patient Education  2019 [employer].

## 2019-01-23 NOTE — Progress Notes (Signed)
Patient ID: Patrick Park, male    DOB: 02-21-1951, 68 y.o.   MRN: [id number]  PCP: Bing Neighbors, FNP  Chief Complaint  Patient presents with  . Diabetes  . Hypertension  . Hyperlipidemia    Subjective:  HPI Patrick Park is a 68 y.o. male presents for evaluation of diabetes, hypercholesterolemia, and hypertension.  Patrick Park has Essential hypertension; Type 2 diabetes mellitus with hyperglycemia, without long-term current use of insulin (HCC); Dyslipidemia; and General weakness on their problem list.   Today's visit:  Diabetes No routine home monitoring of blood pressure. Most recent A1C 7.7, 3 months previously.Current regimen metformin 500 mg BID. Prescribed glipizide, however, ran out of medication. Needs a refill. .Currently prescribed ACE and Statin. Up to date on routine annual eye exam and diabetes foot exam.   Hypertension and Hypercholesteremia Blood pressure elevated today. He has not taken blood pressure medication as he is fasting. Denies chest pain, shortness of breath, weakness, or dizziness. Blood pressure managed in past with lisinopril 20 mg once daily. Patrick Park also suffers from hyperlipidemia. Last lipid panel: elevated triglycerides 247, low HDL 33, and LDL 82. Currently prescribed statin therapy, atorvastatin 10 mg once daily. Patient also is current daily smoker.   GERD  Recent experienced GI issues associated with antibiotics prescribed for recent sinus infection. Patient has been taking medication to close together. He reports symptoms have improved since starting Pepcid and is completing his last day of antibiotic.He denies any diarrhea, nausea, vomiting, fever, shortness of breath or chest tightness.  He does endorse congestion which is worse while outside which impairs his ability to smell until he blows his nose. He was recently prescribed Singulair and Zyrtec for antihistamine therapy.   Constipation  Patient complains of constipation.  He  endorses some water intake although does not drink 6 to 8 glasses of water per day. Endorses hard stool when he has a bowel movement. Endorses vegetable intake and some fruits. He is physically active around the home, endorses no routine physical exercise. Social History   Socioeconomic History  . Marital status: Married    Spouse name: Not on file  . Number of children: Not on file  . Years of education: Not on file  . Highest education level: Not on file  Occupational History  . Not on file  Social Needs  . Financial resource strain: Not on file  . Food insecurity:    Worry: Not on file    Inability: Not on file  . Transportation needs:    Medical: Not on file    Non-medical: Not on file  Tobacco Use  . Smoking status: Current Some Day Smoker  . Smokeless tobacco: Never Used  . Tobacco comment: 0-2/day  Substance and Sexual Activity  . Alcohol use: No  . Drug use: No  . Sexual activity: Not on file  Lifestyle  . Physical activity:    Days per week: Not on file    Minutes per session: Not on file  . Stress: Not on file  Relationships  . Social connections:    Talks on phone: Not on file    Gets together: Not on file    Attends religious service: Not on file    Active member of club or organization: Not on file    Attends meetings of clubs or organizations: Not on file    Relationship status: Not on file  . Intimate partner violence:    Fear of current or ex partner: Not on file  Emotionally abused: Not on file    Physically abused: Not on file    Forced sexual activity: Not on file  Other Topics Concern  . Not on file  Social History Narrative  . Not on file    Family History  Problem Relation Age of Onset  . Colon cancer Neg Hx   . Colon polyps Neg Hx   . Esophageal cancer Neg Hx   . Rectal cancer Neg Hx   . Stomach cancer Neg Hx     Review of Systems Pertinent negatives listed in HPI No Known Allergies  Prior to Admission medications   Medication  Sig Start Date End Date Taking? Authorizing Provider  aspirin EC 81 MG tablet Take 81 mg by mouth daily.    [provider]  atorvastatin (LIPITOR) 10 MG tablet Take 10 mg by mouth at bedtime. 09/30/18   [provider]  cetirizine (ZYRTEC) 10 MG tablet Take 1 tablet (10 mg total) by mouth daily. 01/06/19   Bing NeighborsHarris, Patrick Myler S, FNP  clarithromycin (BIAXIN) 500 MG tablet Take 500 mg by mouth 2 (two) times daily. for 10 days 01/13/19   [provider]  famotidine (PEPCID) 40 MG tablet Take 1 tablet (40 mg total) by mouth daily. 01/16/19   Bing NeighborsHarris, Patrick Paschal S, FNP  Fish Oil OIL Take 1 capsule by mouth daily.    [provider]  fluticasone (FLONASE) 50 MCG/ACT nasal spray Place 2 sprays into both nostrils daily. 01/06/19   Bing NeighborsHarris, Patrick Monforte S, FNP  glipiZIDE (GLUCOTROL) 5 MG tablet Take 1 tablet (5 mg total) by mouth daily before breakfast. Patient not taking: Reported on 01/06/2019 06/07/18   Georgina QuintSagardia, Patrick Jose, MD  glucose blood test strip 1 each by Other route as directed. 10/03/18   Georgina QuintSagardia, Patrick Jose, MD  Lancets MISC OneTouch Delica Lancets 33 gauge  check glucose levels 2-3 times a day DX: E11.65, NPI [id number], Date: 03/21/17    [provider]  lisinopril (ZESTRIL) 20 MG tablet Take 20 mg by mouth daily. 09/30/18   [provider]  metFORMIN (GLUCOPHAGE) 500 MG tablet Take 1 tablet (500 mg total) by mouth 2 (two) times daily with a meal. 06/07/18   Park, Patrick KempfMiguel Jose, MD  montelukast (SINGULAIR) 10 MG tablet Take 10 mg by mouth at bedtime. 01/13/19   [provider]  Multiple Vitamin (MULTIVITAMIN) tablet Take 1 tablet by mouth daily.    [provider]    Past Medical, Surgical Family and Social History reviewed and updated.    Objective:   Today's Vitals   01/23/19 1418  BP: (!) 170/80  Pulse: 73  Resp: 17  Temp: 98.1 F (36.7 C)  TempSrc: Temporal  SpO2: 97%  Weight: 183 lb 9.6 oz (83.3 kg)  Height: 5\' 2"   (1.575 m)    BP Readings from Last 3 Encounters:  01/23/19 (!) 170/80  10/31/18 134/71  10/03/18 131/69    Filed Weights   01/23/19 1418  Weight: 183 lb 9.6 oz (83.3 kg)     Physical Exam General appearance: alert, well developed, well nourished, cooperative and in no distress Head: Normocephalic, without obvious abnormality, atraumatic Respiratory: Respirations even and unlabored, normal respiratory rate Heart: rate and rhythm normal. No gallop or murmurs noted on exam  Extremities: No gross deformities Skin: Skin color, texture, turgor normal. No rashes seen  Psych: Appropriate mood and affect. Neurologic: Mental status: Alert, oriented to person, place, and time, thought content appropriate.  Lab Results  Component Value Date  POCGLU 142 (A) 10/03/2018    Lab Results  Component Value Date   HGBA1C 7.7 (A) 10/03/2018    Assessment & Plan:  1. Type 2 diabetes mellitus without complication, without long-term current use of insulin (HCC), uncertain of control.  Will obtain A1C.  Continue metformin and glipizide.  Encouraged routine physical activity and increase intake of vegetables. - Hemoglobin A1c - Comprehensive metabolic panel   2. Essential hypertension, uncontrolled Resume medication as prescribed. Return in 2 weeks for blood check. Take medication prior to nurse visit.  - POCT URINALYSIS DIP (CLINITEK)  3. Screening PSA (prostate specific antigen) - PSA  4. Hypercholesterolemia with hypertriglyceridemia - Lipid panel  5. Screening for deficiency anemia - CBC with Differential  6. Constipation, unspecified constipation type -Trial Colace 50 mg once daily    Meds ordered this encounter  Medications  . docusate sodium (COLACE) 50 MG capsule    Sig: Take 1 capsule (50 mg total) by mouth daily as needed for mild constipation or moderate constipation (stop once stool is softens).    Dispense:  20 capsule    Refill:  0  . cetirizine (ZYRTEC) 10 MG  tablet    Sig: Take 1 tablet (10 mg total) by mouth daily.    Dispense:  90 tablet    Refill:  2  . montelukast (SINGULAIR) 10 MG tablet    Sig: Take 1 tablet (10 mg total) by mouth at bedtime.    Dispense:  30 tablet    Refill:  2  . atorvastatin (LIPITOR) 10 MG tablet    Sig: Take 1 tablet (10 mg total) by mouth daily at 6 PM.    Dispense:  90 tablet    Refill:  2  . fluticasone (FLONASE) 50 MCG/ACT nasal spray    Sig: Place 2 sprays into both nostrils daily.    Dispense:  16 g    Refill:  2  . lisinopril (ZESTRIL) 20 MG tablet    Sig: Take 1 tablet (20 mg total) by mouth daily.    Dispense:  90 tablet    Refill:  2  . famotidine (PEPCID) 40 MG tablet    Sig: Take 1 tablet (40 mg total) by mouth daily.    Dispense:  10 tablet    Refill:  0  . glipiZIDE (GLUCOTROL) 5 MG tablet    Sig: Take 1 tablet (5 mg total) by mouth daily before breakfast.    Dispense:  60 tablet    Refill:  3  . metFORMIN (GLUCOPHAGE) 500 MG tablet    Sig: Take 1 tablet (500 mg total) by mouth 2 (two) times daily with a meal.    Dispense:  180 tablet    Refill:  3   Follow-up: Return in 2 weeks for blood pressure check  3 months chronic condition follow-up with provider   [name], FNP Primary [facility] at [facility] [address], [place] [address] [phone]: [phone]

## 2019-01-24 LAB — COMPREHENSIVE METABOLIC PANEL
ALT: 64 IU/L — ABNORMAL HIGH (ref 0–44)
AST: 44 IU/L — ABNORMAL HIGH (ref 0–40)
Albumin/Globulin Ratio: 1.7 (ref 1.2–2.2)
Albumin: 4.4 g/dL (ref 3.8–4.8)
Alkaline Phosphatase: 106 IU/L (ref 39–117)
BUN/Creatinine Ratio: 11 (ref 10–24)
BUN: 8 mg/dL (ref 8–27)
Bilirubin Total: 0.3 mg/dL (ref 0.0–1.2)
CO2: 24 mmol/L (ref 20–29)
Calcium: 9.7 mg/dL (ref 8.6–10.2)
Chloride: 102 mmol/L (ref 96–106)
Creatinine, Ser: 0.73 mg/dL — ABNORMAL LOW (ref 0.76–1.27)
GFR calc Af Amer: 112 mL/min/{1.73_m2} (ref >59)
GFR calc non Af Amer: 97 mL/min/{1.73_m2} (ref >59)
Globulin, Total: 2.6 g/dL (ref 1.5–4.5)
Glucose: 120 mg/dL — ABNORMAL HIGH (ref 65–99)
Potassium: 4.5 mmol/L (ref 3.5–5.2)
Sodium: 143 mmol/L (ref 134–144)
Total Protein: 7 g/dL (ref 6.0–8.5)

## 2019-01-24 LAB — CBC WITH DIFFERENTIAL/PLATELET
Basophils Absolute: 0 10*3/uL (ref 0.0–0.2)
Basos: 0 %
EOS (ABSOLUTE): 0.1 10*3/uL (ref 0.0–0.4)
Eos: 1 %
Hematocrit: 41.3 % (ref 37.5–51.0)
Hemoglobin: 14.7 g/dL (ref 13.0–17.7)
Immature Grans (Abs): 0 10*3/uL (ref 0.0–0.1)
Immature Granulocytes: 0 %
Lymphocytes Absolute: 2.7 10*3/uL (ref 0.7–3.1)
Lymphs: 30 %
MCH: 31.5 pg (ref 26.6–33.0)
MCHC: 35.6 g/dL (ref 31.5–35.7)
MCV: 88 fL (ref 79–97)
Monocytes Absolute: 0.8 10*3/uL (ref 0.1–0.9)
Monocytes: 9 %
Neutrophils Absolute: 5.3 10*3/uL (ref 1.4–7.0)
Neutrophils: 60 %
Platelets: 326 10*3/uL (ref 150–450)
RBC: 4.67 x10E6/uL (ref 4.14–5.80)
RDW: 13.8 % (ref 11.6–15.4)
WBC: 8.9 10*3/uL (ref 3.4–10.8)

## 2019-01-24 LAB — LIPID PANEL
Chol/HDL Ratio: 5.5 ratio — ABNORMAL HIGH (ref 0.0–5.0)
Cholesterol, Total: 199 mg/dL (ref 100–199)
HDL: 36 mg/dL — ABNORMAL LOW (ref >39)
LDL Calculated: 124 mg/dL — ABNORMAL HIGH (ref 0–99)
Triglycerides: 195 mg/dL — ABNORMAL HIGH (ref 0–149)
VLDL Cholesterol Cal: 39 mg/dL (ref 5–40)

## 2019-01-24 LAB — HEMOGLOBIN A1C
Est. average glucose Bld gHb Est-mCnc: 174 mg/dL
Hgb A1c MFr Bld: 7.7 % — ABNORMAL HIGH (ref 4.8–5.6)

## 2019-01-24 LAB — PSA: Prostate Specific Ag, Serum: 0.6 ng/mL (ref 0.0–4.0)

## 2019-01-28 ENCOUNTER — Encounter: Payer: Self-pay | Admitting: Family Medicine

## 2019-01-28 ENCOUNTER — Telehealth: Payer: Self-pay | Admitting: Family Medicine

## 2019-01-28 DIAGNOSIS — R748 Abnormal levels of other serum enzymes: Secondary | ICD-10-CM

## 2019-01-28 NOTE — Telephone Encounter (Signed)
Recent labs indicate: Cholesterol remains abnormal compared to last measurement: Triglycerides, bad cholesterol, and LDL are elevated. Will increase atorvastatin 40 mg once daily.  A1C remains at 7.7. Continue Glipizide once daily with breakfast and metformin.  Liver function is elevated and has been elevated previously. I would like to order a liver ultrasound to confirm what is suspect may be fatty liver disease.   Montel Clock- Order placed for US Abdomen RUQ. Please schedule and notify patient.

## 2019-01-30 MED ORDER — ATORVASTATIN CALCIUM 40 MG PO TABS: 40.0000 mg | ORAL_TABLET | Freq: Every day | ORAL | 3 refills | Status: DC

## 2019-01-30 NOTE — Telephone Encounter (Signed)
Insurance states that CPT [phone] doesn't require prior authorization as long as it's done outpatient.  Scheduled ultrasound for 02/06/2019 @ 8 AM. Nothing to eat or drink after midnight.

## 2019-01-30 NOTE — Telephone Encounter (Addendum)
Patient notified of lab results & recommendations using [employer](Luis, [id number]). Expressed understanding. Is agreeable to the increase in Atorvastatin. Prescription for Atorvastatin 40 mg tablet sent to pharmacy. Informed of ultrasound appointment information. Aware to bring a snack to eat afterwards so that his blood sugars don't drop too low.

## 2019-01-30 NOTE — Addendum Note (Signed)
Addended by: Heidi Dach on: 01/30/2019 12:07 PM   Modules accepted: Orders

## 2019-02-06 ENCOUNTER — Ambulatory Visit (INDEPENDENT_AMBULATORY_CARE_PROVIDER_SITE_OTHER): Payer: Medicare Other

## 2019-02-06 ENCOUNTER — Other Ambulatory Visit: Payer: Self-pay

## 2019-02-06 ENCOUNTER — Encounter: Payer: Self-pay | Admitting: Family Medicine

## 2019-02-06 ENCOUNTER — Ambulatory Visit (HOSPITAL_COMMUNITY)
Admission: RE | Admit: 2019-02-06 | Discharge: 2019-02-06 | Disposition: A | Discharge status: Home / Self Care | Payer: Medicare Other | Source: Ambulatory Visit | Attending: Family Medicine | Admitting: Family Medicine

## 2019-02-06 VITALS — BP 151/77 | HR 73

## 2019-02-06 DIAGNOSIS — I10 Essential (primary) hypertension: Secondary | ICD-10-CM | POA: Diagnosis not present

## 2019-02-06 DIAGNOSIS — R748 Abnormal levels of other serum enzymes: Secondary | ICD-10-CM | POA: Diagnosis not present

## 2019-02-06 DIAGNOSIS — Z0130 Encounter for examination of blood pressure without abnormal findings: Secondary | ICD-10-CM

## 2019-02-06 MED ORDER — LISINOPRIL 40 MG PO TABS: 40.0000 mg | ORAL_TABLET | Freq: Every day | ORAL | 1 refills | Status: DC

## 2019-02-06 NOTE — Progress Notes (Addendum)
Patient here for BP check. Has taken medications this morning. BP was 151/77, pulse 73. Spoke with provider & she states she will increase Lisinopril to 40 mg daily & have patient return to office in 8 weeks. Patient repeated new instructions back properly. KWalker, CMA.

## 2019-02-06 NOTE — Progress Notes (Signed)
Patient notified here in office today during a nurse visit findings noted on ultrasound.  Liver appeared to be normal without suspicious lesions.  Incidental findings of right renal cyst.  Patient is asymptomatic and advised that this is likely a benign finding.  Verbalized understanding.

## 2019-02-06 NOTE — Patient Instructions (Addendum)
Increase Lisinopril 40 mg on daily.  Your ultrasound was normal. A benign cyst was seen on the right kidney which is an unremarkable finding. Will repeat liver labs at follow-up.

## 2019-03-24 ENCOUNTER — Other Ambulatory Visit: Payer: Self-pay

## 2019-03-24 DIAGNOSIS — E119 Type 2 diabetes mellitus without complications: Secondary | ICD-10-CM

## 2019-03-24 MED ORDER — GLIPIZIDE 5 MG PO TABS: 5.0000 mg | ORAL_TABLET | Freq: Every day | ORAL | 0 refills | Status: DC

## 2019-03-28 DIAGNOSIS — J309 Allergic rhinitis, unspecified: Secondary | ICD-10-CM | POA: Insufficient documentation

## 2019-04-04 ENCOUNTER — Telehealth: Payer: Self-pay

## 2019-04-04 NOTE — Telephone Encounter (Signed)
Patient advised to go to [facility] for drive thru testing. Patient states that he will go Monday for testing.

## 2019-04-04 NOTE — Telephone Encounter (Signed)
Called patient to do their pre-visit COVID screening.  Have you been tested for COVID or are you currently waiting for COVID test results? no  Have you recently traveled internationally([place], [place], [place], [place], [place]) or within the [place] to a hotspot area([place], [place], [place], [place], [place])? no  Are you currently experiencing any of the following: fever, cough, SHOB, fatigue, body aches, loss of smell, rash, diarrhea, vomiting, severe headaches, weakness, sore throat? Cough, headaches, fatigue, loss of smell  Have you been in contact with anyone who has recently travelled? no  Have you been in contact with anyone who is experiencing any of the above symptoms or been diagnosed with COVID  or works in or has recently visited a SNF? no  Advised patient that we would change visit to a telephone visit & send a message to provider to see if she would like to order COVID testing.

## 2019-04-07 ENCOUNTER — Ambulatory Visit: Payer: Medicare Other | Admitting: Family Medicine

## 2019-04-14 ENCOUNTER — Ambulatory Visit (INDEPENDENT_AMBULATORY_CARE_PROVIDER_SITE_OTHER): Payer: Medicare Other | Admitting: Family Medicine

## 2019-04-14 ENCOUNTER — Encounter: Payer: Self-pay | Admitting: Family Medicine

## 2019-04-14 ENCOUNTER — Other Ambulatory Visit: Payer: Self-pay

## 2019-04-14 DIAGNOSIS — J302 Other seasonal allergic rhinitis: Secondary | ICD-10-CM

## 2019-04-14 DIAGNOSIS — K219 Gastro-esophageal reflux disease without esophagitis: Secondary | ICD-10-CM

## 2019-04-14 DIAGNOSIS — E119 Type 2 diabetes mellitus without complications: Secondary | ICD-10-CM | POA: Diagnosis not present

## 2019-04-14 DIAGNOSIS — I10 Essential (primary) hypertension: Secondary | ICD-10-CM | POA: Diagnosis not present

## 2019-04-14 DIAGNOSIS — E782 Mixed hyperlipidemia: Secondary | ICD-10-CM | POA: Diagnosis not present

## 2019-04-14 MED ORDER — MONTELUKAST SODIUM 10 MG PO TABS: 10.0000 mg | ORAL_TABLET | Freq: Every day | ORAL | 1 refills | Status: AC

## 2019-04-14 MED ORDER — OMEPRAZOLE 20 MG PO CPDR: 20.0000 mg | DELAYED_RELEASE_CAPSULE | Freq: Every day | ORAL | 1 refills | Status: AC

## 2019-04-14 MED ORDER — CETIRIZINE HCL 10 MG PO TABS: 10.0000 mg | ORAL_TABLET | Freq: Every day | ORAL | 2 refills | Status: DC

## 2019-04-14 MED ORDER — GLIPIZIDE 5 MG PO TABS: 5.0000 mg | ORAL_TABLET | Freq: Every day | ORAL | 1 refills | Status: DC

## 2019-04-14 MED ORDER — LISINOPRIL 40 MG PO TABS: 40.0000 mg | ORAL_TABLET | Freq: Every day | ORAL | 1 refills | Status: DC

## 2019-04-14 MED ORDER — METFORMIN HCL 500 MG PO TABS: 500.0000 mg | ORAL_TABLET | Freq: Two times a day (BID) | ORAL | 1 refills | Status: DC

## 2019-04-14 NOTE — Progress Notes (Signed)
Virtual Visit via Telephone Note  I connected with Patrick Park 04/14/19 at  3:50 PM EDT by telephone and verified that I am speaking with the correct person using two identifiers.   I discussed the limitations, risks, security and privacy concerns of performing an evaluation and management service by telephone and the availability of in person appointments. I also discussed with the patient that there may be a patient responsible charge related to this service. The patient expressed understanding and agreed to proceed.  Patient Location: Home Provider Location: Office at [facility] Others participating in call: call initiated by Patrick Park who also obtained a Spanish speaking interpreter through [facility] Interpretation services due to a language barrier  History of Present Illness:        68 year old male with history of diabetes, dyslipidemia, GERD, hypertension and allergic rhinitis who is seen in follow-up.  Patient reports that as long as he has his medications, his high blood pressure, lipids and reflux symptoms are controlled.  Patient reports some current nasal congestion at night.  He is taking the Montelukast was not sure if he could also take cetirizine as well therefore he is only been taking one medication and has had some nasal congestion.        He is checking his blood sugars at home.  He reports fasting blood sugars that are generally 140 or less.  He denies increased thirst, no urinary frequency and no blurred vision related to his diabetes.  He denies any headaches or dizziness related to his blood pressure.  He denies increased muscle or joint pain related to his use of cholesterol medication.  Overall he feels well, on additional review of systems he denies any chest pain or palpitations, no shortness of breath or cough, no fever or chills, no sore throat or difficulty swallowing, no abdominal pain-no nausea/vomiting/diarrhea or constipation, no urinary frequency or dysuria  and no leg swelling or joint pain.          Past Medical History:  Diagnosis Date  . Allergy   . Benign paroxysmal positional vertigo   . Diabetes mellitus without complication (Patrick Park)   . GERD (gastroesophageal reflux disease)   . Hyperlipidemia   . Hypertension     Past Surgical History:  Procedure Laterality Date  . COLONOSCOPY     11-12 yrs ago- normal per pt-pt doesnt remeber where he had the colonoscopy   . HERNIA REPAIR      Family History  Problem Relation Age of Onset  . Colon cancer Neg Hx   . Colon polyps Neg Hx   . Esophageal cancer Neg Hx   . Rectal cancer Neg Hx   . Stomach cancer Neg Hx     Social History   Tobacco Use  . Smoking status: Current Some Day Smoker  . Smokeless tobacco: Never Used  . Tobacco comment: 0-2/day  Substance Use Topics  . Alcohol use: No  . Drug use: No     No Known Allergies     Observations/Objective: No vital signs or physical exam conducted as visit was done via telephone  Assessment and Plan: 1. Type 2 diabetes mellitus without complication, without long-term current use of insulin (Patrick Park)  Patient had hemoglobin A1c on 01/23/2019 of 7.7.  Goal hemoglobin A1c of 7 or less.  Continue Glucophage and glipizide which were refilled.  Continue to monitor blood sugars and follow a low-carb diet.  Patient will have CMP and hemoglobin A1c at a future lab visit in  about 3 to 4 weeks.  Lab orders placed.  Patient also with prior elevated LFTs which will be rechecked as part of his CMP with upcoming labs. - Comprehensive metabolic panel; Future - Hemoglobin A1c; Future - metFORMIN (GLUCOPHAGE) 500 MG tablet; Take 1 tablet (500 mg total) by mouth 2 (two) times daily with a meal.  Dispense: 180 tablet; Refill: 1 - glipiZIDE (GLUCOTROL) 5 MG tablet; Take 1 tablet (5 mg total) by mouth daily before breakfast.  Dispense: 90 tablet; Refill: 1  2. Essential hypertension Patient reports his blood pressures currently controlled with the use of  lisinopril and new prescription provided.  Low sodium diet encouraged. - lisinopril (ZESTRIL) 40 MG tablet; Take 1 tablet (40 mg total) by mouth daily.  Dispense: 90 tablet; Refill: 1  3. Hypercholesterolemia with hypertriglyceridemia Patient is currently on atorvastatin and low-fat diet encouraged.  4. Seasonal allergies Refill provided of montelukast and patient also made aware that he can also take cetirizine at bedtime to help with nasal congestion. - montelukast (SINGULAIR) 10 MG tablet; Take 1 tablet (10 mg total) by mouth at bedtime.  Dispense: 90 tablet; Refill: 1 - cetirizine (ZYRTEC) 10 MG tablet; Take 1 tablet (10 mg total) by mouth daily.  Dispense: 90 tablet; Refill: 2  5. Gastroesophageal reflux disease, esophagitis presence not specified Omeprazole refilled and patient should avoid spicy/greasy foods or medicines which he knows tend to trigger his acid reflux.  Patient should also avoid eating within 2 hours of bedtime. - omeprazole (PRILOSEC) 20 MG capsule; Take 1 capsule (20 mg total) by mouth daily.  Dispense: 90 capsule; Refill: 1  Follow Up Instructions:Return for Diabetes/hypertension-lab visit in approximately 4 weeks and 3 to 8014-month follow-up..    I discussed the assessment and treatment plan with the patient. The patient was provided an opportunity to ask questions and all were answered. The patient agreed with the plan and demonstrated an understanding of the instructions.   The patient was advised to call back or seek an in-person evaluation if the symptoms worsen or if the condition fails to improve as anticipated.  I provided 12 minutes of non-face-to-face time during this encounter.   Cain Saupeammie Renesmay Nesbitt, MD

## 2019-04-25 ENCOUNTER — Ambulatory Visit: Payer: Medicare Other | Admitting: Family Medicine

## 2019-04-28 ENCOUNTER — Telehealth: Payer: Self-pay

## 2019-04-28 NOTE — Telephone Encounter (Signed)
Called patient to do their pre-visit COVID screening(Eduardo [phone]).  Have you tested positive for COVID or are you currently waiting for COVID test results? no  Have you recently traveled internationally([place], [place], [place], [place], [place]) or within the [place] to a hotspot area([place], [place], [place], [place], [place])? no  Are you currently experiencing any of the following symptoms: fever, cough, SHOB, fatigue, body aches, loss of smell/taste, rash, diarrhea, vomiting, severe headaches, weakness, sore throat? Loss of smell, headaches, rash, body aches  Have you been in contact with anyone who has recently travelled? no  Have you been in contact with anyone who is experiencing any of the above symptoms or been diagnosed with COVID  or works in or has recently visited a SNF? no

## 2019-04-29 ENCOUNTER — Other Ambulatory Visit: Payer: Self-pay

## 2019-04-29 ENCOUNTER — Ambulatory Visit (INDEPENDENT_AMBULATORY_CARE_PROVIDER_SITE_OTHER): Payer: Medicare Other | Admitting: Internal Medicine

## 2019-04-29 ENCOUNTER — Encounter: Payer: Self-pay | Admitting: Internal Medicine

## 2019-04-29 DIAGNOSIS — R1013 Epigastric pain: Secondary | ICD-10-CM

## 2019-04-29 DIAGNOSIS — R21 Rash and other nonspecific skin eruption: Secondary | ICD-10-CM

## 2019-04-29 MED ORDER — TRIAMCINOLONE ACETONIDE 0.1 % EX CREA: 1.0000 "application " | TOPICAL_CREAM | Freq: Two times a day (BID) | CUTANEOUS | 0 refills | Status: DC

## 2019-04-29 NOTE — Progress Notes (Signed)
Virtual Visit via Telephone Note  [facility] Due to current restrictions/limitations of in-office visits due to the COVID-19 pandemic, this scheduled clinical appointment was converted to a telehealth visit  I connected with Patrick Park on 04/29/19 at 3:31 p.m by telephone and verified that I am speaking with the correct person using two identifiers. I am in my office.  The patient is at home.  Only the patient and myself participated in this encounter.  I discussed the limitations, risks, security and privacy concerns of performing an evaluation and management service by telephone and the availability of in person appointments. I also discussed with the patient that there may be a patient responsible charge related to this service. The patient expressed understanding and agreed to proceed.   History of Present Illness: Patient with history of DM type II, HTN, HL, seasonal allergies, GERD.  Evaluated by Dr. Chapman Fitch 04/14/2019.  Purchase of today's visit is UC  Pt c/o bloating after meals x 3-4 mths.  Occurs with any types of food.  Denies any nausea or vomiting.  Drinking mineral water helps.  He also finds that omeprazole helps  C/O itchy rash on  RT arm after using what sounds like a power washer yesterday.  Arm is itchy and red.  No blisters.  He is requesting a cream to help decrease the itching.  Observations/Objective: No direct observation done as this was a telephone encounter  Assessment and Plan: 1. Rash and nonspecific skin eruption - triamcinolone cream (KENALOG) 0.1 %; Apply 1 application topically 2 (two) times daily.  Dispense: 30 g; Refill: 0  2. Dyspepsia We will have him come to do an H. pylori breath test.  Further management will be based on results - H. pylori breath test   Follow Up Instructions:    I discussed the assessment and treatment plan with the patient. The patient was provided an opportunity to ask questions and all were answered. The patient  agreed with the plan and demonstrated an understanding of the instructions.   The patient was advised to call back or seek an in-person evaluation if the symptoms worsen or if the condition fails to improve as anticipated.  I provided 7 minutes of non-face-to-face time during this encounter.   Karle Plumber, MD

## 2019-04-29 NOTE — Progress Notes (Signed)
Wants to know if Omeprazole can be increased to 40 mg.   States that FSBS have been running between 140-160s. Denies nausea, vomiting, numbness/tingling in his feet, polyuria, polydipsia, vision changes.

## 2019-05-01 ENCOUNTER — Other Ambulatory Visit: Payer: Medicare Other

## 2019-05-01 ENCOUNTER — Other Ambulatory Visit: Payer: Self-pay

## 2019-05-01 DIAGNOSIS — E119 Type 2 diabetes mellitus without complications: Secondary | ICD-10-CM

## 2019-05-01 NOTE — Progress Notes (Signed)
Patient here for fasting labs & H. Pylori test.

## 2019-05-02 LAB — COMPREHENSIVE METABOLIC PANEL WITH GFR
ALT: 37 IU/L (ref 0–44)
AST: 26 IU/L (ref 0–40)
Albumin/Globulin Ratio: 2.3 — ABNORMAL HIGH (ref 1.2–2.2)
Albumin: 4.8 g/dL (ref 3.8–4.8)
Alkaline Phosphatase: 150 IU/L — ABNORMAL HIGH (ref 39–117)
BUN/Creatinine Ratio: 17 (ref 10–24)
BUN: 12 mg/dL (ref 8–27)
Bilirubin Total: 0.3 mg/dL (ref 0.0–1.2)
CO2: 23 mmol/L (ref 20–29)
Calcium: 9.8 mg/dL (ref 8.6–10.2)
Chloride: 105 mmol/L (ref 96–106)
Creatinine, Ser: 0.71 mg/dL — ABNORMAL LOW (ref 0.76–1.27)
GFR calc Af Amer: 113 mL/min/1.73
GFR calc non Af Amer: 98 mL/min/1.73
Globulin, Total: 2.1 g/dL (ref 1.5–4.5)
Glucose: 177 mg/dL — ABNORMAL HIGH (ref 65–99)
Potassium: 4.8 mmol/L (ref 3.5–5.2)
Sodium: 143 mmol/L (ref 134–144)
Total Protein: 6.9 g/dL (ref 6.0–8.5)

## 2019-05-02 LAB — HEMOGLOBIN A1C
Est. average glucose Bld gHb Est-mCnc: 169 mg/dL
Hgb A1c MFr Bld: 7.5 % — ABNORMAL HIGH (ref 4.8–5.6)

## 2019-05-02 LAB — H. PYLORI BREATH TEST: H pylori Breath Test: NEGATIVE

## 2019-05-06 NOTE — Progress Notes (Signed)
Patient notified of results & recommendations(Patrick Park [phone]). Expressed understanding.

## 2019-05-06 NOTE — Progress Notes (Signed)
Patient notified of results & recommendations(Patrick Park [phone]). Expressed understanding.

## 2019-05-13 ENCOUNTER — Telehealth: Payer: Self-pay | Admitting: Family Medicine

## 2019-05-13 DIAGNOSIS — E1165 Type 2 diabetes mellitus with hyperglycemia: Secondary | ICD-10-CM

## 2019-05-13 MED ORDER — GLUCOSE BLOOD VI STRP: ORAL_STRIP | 0 refills | Status: DC

## 2019-05-13 NOTE — Telephone Encounter (Signed)
Prescription for test strips sent to pharmacy.  Omeprazole prescription was written 04/14/2019, #90. It is too soon for refill.

## 2019-05-13 NOTE — Telephone Encounter (Signed)
1) Medication(s) Requested (by name): glucose blood test strip [[id number]]   omeprazole (PRILOSEC) 20 MG capsule [[id number]]    2) Pharmacy of Choice:  [place] ([place]), [place] - [address]  Requesting 3 months for both   Approved medications will be sent to pharmacy, we will reach out to you if there is an issue.  Requests made after 3pm may not be addressed until following business day!

## 2019-06-04 ENCOUNTER — Other Ambulatory Visit: Payer: Self-pay

## 2019-06-04 DIAGNOSIS — Z20822 Contact with and (suspected) exposure to covid-19: Secondary | ICD-10-CM

## 2019-06-05 LAB — NOVEL CORONAVIRUS, NAA: SARS-CoV-2, NAA: NOT DETECTED

## 2019-08-01 ENCOUNTER — Telehealth: Payer: Self-pay

## 2019-08-01 NOTE — Telephone Encounter (Signed)
Called patient to do their pre-visit COVID screening.  Call went to voicemail. Unable to do prescreening.  Unsure if patient will be keeping appointment. Patient has had multiple chronic prescriptions written by Fredrich Romans, PA-C at [facility] starting 06/13/19.

## 2019-08-04 ENCOUNTER — Ambulatory Visit: Payer: Medicare Other

## 2019-08-19 ENCOUNTER — Telehealth: Payer: Self-pay | Admitting: Physician Assistant

## 2019-08-19 NOTE — Telephone Encounter (Signed)
1) Medication(s) Requested (by name): -omeprazole (PRILOSEC) 20 MG capsule   2) Pharmacy of Choice: -[place] ([place]), [place] - [address]

## 2019-08-29 ENCOUNTER — Other Ambulatory Visit: Payer: Self-pay | Admitting: Pulmonary Disease

## 2019-08-29 DIAGNOSIS — G4733 Obstructive sleep apnea (adult) (pediatric): Secondary | ICD-10-CM

## 2019-09-23 ENCOUNTER — Other Ambulatory Visit: Payer: Self-pay

## 2019-09-23 ENCOUNTER — Ambulatory Visit (INDEPENDENT_AMBULATORY_CARE_PROVIDER_SITE_OTHER): Payer: Medicare Other | Admitting: Internal Medicine

## 2019-09-23 VITALS — BP 164/76 | HR 78 | Temp 98.5°F | Resp 18 | Ht 64.0 in | Wt 188.0 lb

## 2019-09-23 DIAGNOSIS — R0981 Nasal congestion: Secondary | ICD-10-CM

## 2019-09-23 DIAGNOSIS — E119 Type 2 diabetes mellitus without complications: Secondary | ICD-10-CM | POA: Diagnosis not present

## 2019-09-23 DIAGNOSIS — Z23 Encounter for immunization: Secondary | ICD-10-CM

## 2019-09-23 DIAGNOSIS — Z6832 Body mass index (BMI) 32.0-32.9, adult: Secondary | ICD-10-CM

## 2019-09-23 DIAGNOSIS — I10 Essential (primary) hypertension: Secondary | ICD-10-CM

## 2019-09-23 DIAGNOSIS — E6609 Other obesity due to excess calories: Secondary | ICD-10-CM | POA: Diagnosis not present

## 2019-09-23 DIAGNOSIS — E66811 Obesity, class 1: Secondary | ICD-10-CM | POA: Insufficient documentation

## 2019-09-23 LAB — GLUCOSE, POCT (MANUAL RESULT ENTRY): POC Glucose: 158 mg/dl — AB (ref 70–99)

## 2019-09-23 LAB — POCT GLYCOSYLATED HEMOGLOBIN (HGB A1C): Hemoglobin A1C: 8.5 % — AB (ref 4.0–5.6)

## 2019-09-23 MED ORDER — METFORMIN HCL 1000 MG PO TABS: 1000.0000 mg | ORAL_TABLET | Freq: Two times a day (BID) | ORAL | 4 refills | Status: DC

## 2019-09-23 MED ORDER — FLUTICASONE PROPIONATE 50 MCG/ACT NA SUSP: 2.0000 | Freq: Every day | NASAL | 6 refills | Status: DC

## 2019-09-23 MED ORDER — LANCETS MISC: 6 refills | Status: DC

## 2019-09-23 MED ORDER — FARXIGA 5 MG PO TABS: 5.0000 mg | ORAL_TABLET | Freq: Every day | ORAL | 3 refills | Status: DC

## 2019-09-23 MED ORDER — GLUCOSE BLOOD VI STRP: ORAL_STRIP | 6 refills | Status: DC

## 2019-09-23 NOTE — Progress Notes (Signed)
Patient ID: Patrick Park, male    DOB: [birth year]  MRN: [id number]  CC: Diabetes   Subjective: Patrick Park is a 69 y.o. male who presents for chronic disease management at [facility] His concerns today include:  Patient with history of DM type II, HTN, HL, seasonal allergies, GERD.  Evaluated by Dr. Jillyn Hidden 04/14/2019.  DIABETES TYPE 2 Last A1C:   Results for orders placed or performed in visit on 09/23/19  HgB A1c  Result Value Ref Range   Hemoglobin A1C 8.5 (A) 4.0 - 5.6 %   HbA1c POC (<> result, manual entry)     HbA1c, POC (prediabetic range)     HbA1c, POC (controlled diabetic range)    Glucose (CBG)  Result Value Ref Range   POC Glucose 158 (A) 70 - 99 mg/dl    Med Adherence:  []  Yes    [x]  No.  Suppose to be on Metformin 500 BID and Glucotrol 5 mg daily.  He stopped the Glucotrol 3 mths ago because it was making BS too low.  Tells me he is now taking Metformin 1 gram BID even though rxn written as 500 mg BID Medication side effects:  [x]  Yes -diarrhea sometimes from Metformin but this is not too bothersome for him. Home Monitoring?  [x]  Yes  Before BF and at bedtime Home glucose results range: does not have log with him today.  Morning range 115-120; at bedtime 120-250. A1C up one point Diet Adherence: use artificial sweeteners.  Eat a lot of rice Exercise: []  Yes    [x]  No Hypoglycemic episodes?: []  Yes    [x]  No Numbness of the feet? []  Yes    [x]  No Retinopathy hx? []  Yes    [x]  No Last eye exam:  Comments:   HYPERTENSION Currently taking: see medication list Med Adherence: [x]  Yes but did not take Lisinopril as yet for the morning    []  No Medication side effects: []  Yes    [x]  No Adherence with salt restriction: [x]  Yes    []  No Home Monitoring?: [x]  Yes    []  No Monitoring Frequency: twice a day Home BP results range: 130-140s/70s SOB? [x]  Yes    []  No Chest Pain?: []  Yes    [x]  No Leg swelling?: []  Yes    [x]  No Headaches?: [x]  Yes  Due to a lot of  nasal congestion.  Given Singulair but it does not help.  + cough at night.  No drainage at back of throat Dizziness? []  Yes    [x]  No Comments:   HL: compliant with Lipitor.  No muscle aches.  HM:  Had flu shot at [place] 3 mths ago.   Patient Active Problem List   Diagnosis Date Noted  . Allergic rhinitis 03/28/2019  . Essential hypertension 10/03/2018  . Type 2 diabetes mellitus with hyperglycemia, without long-term current use of insulin (HCC) 10/03/2018  . Dyslipidemia 10/03/2018  . General weakness 10/03/2018  . ED (erectile dysfunction) 01/22/2013  . GERD (gastroesophageal reflux disease) 10/24/2012     Current Outpatient Medications on File Prior to Visit  Medication Sig Dispense Refill  . aspirin EC 81 MG tablet Take 81 mg by mouth daily.    atorvastatin (LIPITOR) 40 MG tablet Take 1 tablet (40 mg total) by mouth daily. 90 tablet 3  . Fish Oil OIL Take 1 capsule by mouth daily.    lisinopril (ZESTRIL) 40 MG tablet Take 1 tablet (40 mg total) by mouth daily. 90 tablet 1  .  montelukast (SINGULAIR) 10 MG tablet Take 1 tablet (10 mg total) by mouth at bedtime. 90 tablet 1  . Multiple Vitamin (MULTIVITAMIN) tablet Take 1 tablet by mouth daily.    Marland Kitchen omeprazole (PRILOSEC) 20 MG capsule Take 1 capsule (20 mg total) by mouth daily. 90 capsule 1  . triamcinolone cream (KENALOG) 0.1 % Apply 1 application topically 2 (two) times daily. 30 g 0   No current facility-administered medications on file prior to visit.    No Known Allergies  Social History   Socioeconomic History  . Marital status: Married    Spouse name: Not on file  . Number of children: Not on file  . Years of education: Not on file  . Highest education level: Not on file  Occupational History  . Not on file  Tobacco Use  . Smoking status: Current Some Day Smoker  . Smokeless tobacco: Never Used  . Tobacco comment: 0-2/day  Substance and Sexual Activity  . Alcohol use: No  . Drug use: No  . Sexual  activity: Not on file  Other Topics Concern  . Not on file  Social History Narrative  . Not on file   Social Determinants of Health   Financial Resource Strain:   . Difficulty of Paying Living Expenses: Not on file  Food Insecurity:   . Worried About Programme researcher, broadcasting/film/video in the Last Year: Not on file  . Ran Out of Food in the Last Year: Not on file  Transportation Needs:   . Lack of Transportation (Medical): Not on file  . Lack of Transportation (Non-Medical): Not on file  Physical Activity:   . Days of Exercise per Week: Not on file  . Minutes of Exercise per Session: Not on file  Stress:   . Feeling of Stress : Not on file  Social Connections:   . Frequency of Communication with Friends and Family: Not on file  . Frequency of Social Gatherings with Friends and Family: Not on file  . Attends Religious Services: Not on file  . Active Member of Clubs or Organizations: Not on file  . Attends Banker Meetings: Not on file  . Marital Status: Not on file  Intimate Partner Violence:   . Fear of Current or Ex-Partner: Not on file  . Emotionally Abused: Not on file  . Physically Abused: Not on file  . Sexually Abused: Not on file    Family History  Problem Relation Age of Onset  . Colon cancer Neg Hx   . Colon polyps Neg Hx   . Esophageal cancer Neg Hx   . Rectal cancer Neg Hx   . Stomach cancer Neg Hx     Past Surgical History:  Procedure Laterality Date  . COLONOSCOPY     11-12 yrs ago- normal per pt-pt doesnt remeber where he had the colonoscopy   . HERNIA REPAIR      ROS: Review of Systems Negative except as stated above  PHYSICAL EXAM: BP (!) 164/76 (BP Location: Right Arm, Patient Position: Sitting, Cuff Size: Normal)   Pulse 78   Temp 98.5 F (36.9 C) (Oral)   Resp 18   Ht 5\' 4"  (1.626 m)   Wt 188 lb (85.3 kg)   SpO2 98%   BMI 32.27 kg/m   Wt Readings from Last 3 Encounters:  09/23/19 188 lb (85.3 kg)  01/23/19 183 lb 9.6 oz (83.3 kg)   10/31/18 190 lb 12.8 oz (86.5 kg)    Physical Exam  General appearance - alert, well appearing, obese older Hispanic male and in no distress Mental status - normal mood, behavior, speech, dress, motor activity, and thought processes Nose - normal and patent, no erythema, discharge or polyps Mouth - mucous membranes moist, pharynx normal without lesions Neck - supple, no significant adenopathy Chest - clear to auscultation, no wheezes, rales or rhonchi, symmetric air entry Abdomen - soft, nontender, nondistended, no masses or organomegaly Extremities - peripheral pulses normal, no pedal edema, no clubbing or cyanosis  CMP Latest Ref Rng & Units 05/01/2019 01/23/2019 10/03/2018  Glucose 65 - 99 mg/dL 177(H) 120(H) 142(H)  BUN 8 - 27 mg/dL 12 8 11   Creatinine 0.76 - 1.27 mg/dL 0.71(L) 0.73(L) 0.89  Sodium 134 - 144 mmol/L 143 143 140  Potassium 3.5 - 5.2 mmol/L 4.8 4.5 4.6  Chloride 96 - 106 mmol/L 105 102 102  CO2 20 - 29 mmol/L 23 24 22   Calcium 8.6 - 10.2 mg/dL 9.8 9.7 10.1  Total Protein 6.0 - 8.5 g/dL 6.9 7.0 7.0  Total Bilirubin 0.0 - 1.2 mg/dL 0.3 0.3 0.3  Alkaline Phos 39 - 117 IU/L 150(H) 106 118(H)  AST 0 - 40 IU/L 26 44(H) 41(H)  ALT 0 - 44 IU/L 37 64(H) 57(H)   Lipid Panel     Component Value Date/Time   CHOL 199 01/23/2019 1439   TRIG 195 (H) 01/23/2019 1439   HDL 36 (L) 01/23/2019 1439   CHOLHDL 5.5 (H) 01/23/2019 1439   LDLCALC 124 (H) 01/23/2019 1439    CBC    Component Value Date/Time   WBC 8.9 01/23/2019 1439   WBC 13.7 (H) 11/03/2014 1041   RBC 4.67 01/23/2019 1439   RBC 4.54 11/03/2014 1041   HGB 14.7 01/23/2019 1439   HCT 41.3 01/23/2019 1439   PLT 326 01/23/2019 1439   MCV 88 01/23/2019 1439   MCH 31.5 01/23/2019 1439   MCH 31.3 11/03/2014 1041   MCHC 35.6 01/23/2019 1439   MCHC 35.1 11/03/2014 1041   RDW 13.8 01/23/2019 1439   LYMPHSABS 2.7 01/23/2019 1439   MONOABS 1.8 (H) 11/03/2014 1041   EOSABS 0.1 01/23/2019 1439   BASOSABS 0.0 01/23/2019  1439    ASSESSMENT AND PLAN:  1. Type 2 diabetes mellitus without complication, without long-term current use of insulin (HCC) Not at goal. Discussed the importance of healthy eating habits, regular aerobic exercise (at least 150 minutes a week as tolerated) and medication compliance to achieve or maintain control of diabetes. -Patient agreeable to referral to nutritionist for dietary counseling. Discontinue glipizide.  Continue Metformin and I have changed prescription to reflect what he is taking which is 1 g twice a day.  Add [place].  Advised to stay hydrated by drinking several glasses of water daily while on Farxiga. - HgB A1c - Glucose (CBG) - metFORMIN (GLUCOPHAGE) 1000 MG tablet; Take 1 tablet (1,000 mg total) by mouth 2 (two) times daily with a meal.  Dispense: 180 tablet; Refill: 4 - dapagliflozin propanediol (FARXIGA) 5 MG TABS tablet; Take 5 mg by mouth daily before breakfast.  Dispense: 30 tablet; Refill: 3 - glucose blood test strip; Use to check FSBS daily. Dx: E11.9  Dispense: 100 each; Refill: 6 - Lancets MISC; OneTouch Delica Lancets 33 gauge  check glucose levels 2-3 times a day DX: E11.65, NPI [id number], Date: 03/21/17  Dispense: 100 each; Refill: 6 - Amb ref to Medical Nutrition Therapy-MNT  2. Essential hypertension Not at goal.  Patient has not taken lisinopril as  yet for the morning. We will have him return in 1 month for recheck of blood pressure by CMA  3. Class 1 obesity due to excess calories with serious comorbidity and body mass index (BMI) of 32.0 to 32.9 in adult See #1 above - Amb ref to Medical Nutrition Therapy-MNT  4. Need for prophylactic vaccination against Streptococcus pneumoniae (pneumococcus) Given  5. Nasal congestion Trial of Flonase nasal spray   Patient will follow up with Dr. Earlene Plater here at [facility] in 4 months  Patient was given the opportunity to ask questions.  Patient verbalized understanding of the plan and was able to  repeat key elements of the plan.  [employer] interpreter used during this encounter [phone], Maureen Ralphs.   Orders Placed This Encounter  Procedures  . Pneumococcal polysaccharide vaccine 23-valent greater than or equal to 2yo subcutaneous/IM  . Amb ref to Medical Nutrition Therapy-MNT  . HgB A1c  . Glucose (CBG)     Requested Prescriptions   Signed Prescriptions Disp Refills  . metFORMIN (GLUCOPHAGE) 1000 MG tablet 180 tablet 4    Sig: Take 1 tablet (1,000 mg total) by mouth 2 (two) times daily with a meal.  . dapagliflozin propanediol (FARXIGA) 5 MG TABS tablet 30 tablet 3    Sig: Take 5 mg by mouth daily before breakfast.  . glucose blood test strip 100 each 6    Sig: Use to check FSBS daily. Dx: E11.9  . Lancets MISC 100 each 6    Sig: OneTouch Delica Lancets 33 gauge  check glucose levels 2-3 times a day DX: E11.65, NPI [id number], Date: 03/21/17  . fluticasone (FLONASE) 50 MCG/ACT nasal spray 16 g 6    Sig: Place 2 sprays into both nostrils daily.    Return in about 4 months (around 01/21/2020) for Dr. Earlene Plater .  Jonah Blue, MD, FACP

## 2019-09-23 NOTE — Progress Notes (Signed)
158  164 76  

## 2019-09-23 NOTE — Patient Instructions (Addendum)
Please give patient an appointment with CMA for repeat BP check in 1 mth  Diabetes mellitus y nutricin, en adultos Diabetes Mellitus and Nutrition, Adult Si sufre de diabetes (diabetes mellitus), es muy importante tener hbitos alimenticios saludables debido a que sus niveles de Psychologist, counselling sangre (glucosa) se ven afectados en gran medida por lo que come y bebe. [place] alimentos saludables en las cantidades [place], aproximadamente a la [employer], [place] ayudar a:  Scientist, physiological glucemia.  Disminuir el riesgo de sufrir una enfermedad cardaca.  Mejorar la presin arterial.  Barista o mantener un peso saludable. Todas las personas que sufren de diabetes son diferentes y cada una tiene necesidades diferentes en cuanto a un plan de alimentacin. El mdico puede recomendarle que trabaje con un especialista en dietas y nutricin (nutricionista) para Tax adviser plan para usted. Su plan de alimentacin puede variar segn factores como:  Las caloras que necesita.  Los medicamentos que toma.  Su peso.  Sus niveles de glucemia, presin arterial y colesterol.  Su nivel de [place].  Otras afecciones que tenga, como enfermedades cardacas o renales. Cmo me afectan los carbohidratos? Los carbohidratos, o hidratos de carbono, afectan su nivel de glucemia ms que cualquier otro tipo de alimento. La ingesta de carbohidratos naturalmente aumenta la cantidad de [employer]. El recuento de carbohidratos es un mtodo destinado a Midwife un registro de la cantidad de carbohidratos que se consumen. El recuento de carbohidratos es importante para Pharmacologist la glucemia a un nivel saludable, especialmente si utiliza insulina o toma determinados medicamentos por va oral para la diabetes. Es importante conocer la cantidad de carbohidratos que se pueden ingerir en cada comida sin correr Surveyor, minerals. Esto es Government social research officer. Su nutricionista puede ayudarlo a calcular la  cantidad de carbohidratos que debe ingerir en cada comida y en cada refrigerio. Entre los alimentos que contienen carbohidratos, se incluyen:  Pan, cereal, arroz, pastas y galletas.  Papas y maz.  Guisantes, frijoles y lentejas.  Leche y Dentist.  Nils Pyle y [place].  Postres, como pasteles, galletas, helado y caramelos. Cmo me afecta el alcohol? El alcohol puede provocar disminuciones sbitas de la glucemia (hipoglucemia), especialmente si utiliza insulina o toma determinados medicamentos por va oral para la diabetes. La hipoglucemia es una afeccin potencialmente mortal. Los sntomas de la hipoglucemia (somnolencia, mareos y confusin) son similares a los sntomas de haber consumido demasiado alcohol. Si el mdico afirma que el alcohol es seguro para usted, [place] estas pautas:  Limite el consumo de alcohol a no ms de por da si es mujer y no est [place], y a si es hombre. Una medida equivale a 12oz ( ) de cerveza, 5oz ( ) de vino o 1oz (83ml) de bebidas alcohlicas de alta graduacin.  No beba con el estmago vaco.  Mantngase hidratado bebiendo agua, refrescos dietticos o t helado sin azcar.  Tenga en cuenta que los refrescos comunes, los jugos y otras bebida para Engineer, manufacturing pueden contener mucha azcar y se deben contar como carbohidratos. Cules son algunos consejos para seguir este plan?  Leer las etiquetas de los alimentos  Comience por leer el tamao de la porcin en la "Informacin nutricional" en las etiquetas de los alimentos envasados y las bebidas. La cantidad de caloras, carbohidratos, grasas y otros nutrientes mencionados en la etiqueta se basan en una porcin del alimento. Muchos alimentos contienen ms de una porcin por envase.  Verifique la cantidad total de gramos (g) de carbohidratos  totales en una porcin. Puede calcular la cantidad de porciones de carbohidratos al dividir el total de carbohidratos por 15. Por ejemplo, si un  alimento tiene un total de 30g de carbohidratos, equivale a 2 porciones de carbohidratos.  Verifique la cantidad de gramos (g) de grasas saturadas y grasas trans en una porcin. Escoja alimentos que no contengan grasa o que tengan un bajo contenido.  Verifique la cantidad de miligramos (mg) de sal (sodio) en una porcin. [place] [employer] de las personas deben limitar la ingesta de sodio total a menos de 2300mg  por Training and development officer.  Siempre consulte la informacin nutricional de los alimentos etiquetados como "con bajo contenido de grasa" o "sin grasa". Estos alimentos pueden tener un mayor contenido de Location manager agregada o carbohidratos refinados, y deben evitarse.  Hable con su nutricionista para identificar sus objetivos diarios en cuanto a los nutrientes mencionados en la etiqueta. Al ir de compras  Evite comprar alimentos procesados, enlatados o precocinados. Estos alimentos tienden a Special educational needs teacher mayor cantidad de [place], sodio y azcar agregada.  Compre en la zona exterior de la tienda de comestibles. Esta zona incluye frutas y verduras frescas, granos a granel, carnes frescas y productos lcteos frescos. Al cocinar  Utilice mtodos de coccin a baja temperatura, como hornear, en lugar de mtodos de coccin a alta temperatura, como frer en abundante aceite.  Cocine con aceites saludables, como el aceite de [place], canola o [place].  Evite cocinar con manteca, crema o carnes con alto contenido de grasa. Planificacin de las comidas  Coma las comidas y los refrigerios regularmente, preferentemente a la misma hora todos [place]. Evite pasar largos perodos de tiempo sin comer.  Consuma alimentos ricos en fibra, como frutas frescas, verduras, frijoles y cereales integrales. Consulte a su nutricionista sobre cuntas porciones de carbohidratos puede consumir en cada comida.  Consuma entre 4 y 6 onzas (oz) de protenas magras por da, como carnes [place], pollo, pescado, huevos o tofu. Una onza de protena magra  equivale a: ? 1 onza de carne, pollo o pescado. ? 1huevo. ?  taza de tofu.  Coma algunos alimentos por da que contengan grasas saludables, como aguacates, frutos secos, semillas y pescado. Estilo de vida  Controle su nivel de glucemia con regularidad.  Haga actividad fsica habitualmente como se lo haya indicado el mdico. Esto puede incluir lo siguiente: ? 131minutos semanales de ejercicio de intensidad moderada o alta. Esto podra incluir caminatas dinmicas, ciclismo o gimnasia acutica. ? Realizar ejercicios de elongacin y de fortalecimiento, como yoga o levantamiento de pesas, por lo menos 2veces por semana.  Tome los [employer] se lo haya indicado el mdico.  No consuma ningn producto que contenga nicotina o tabaco, como cigarrillos y Psychologist, sport and exercise. Si necesita ayuda para dejar de fumar, consulte al [employer] con un asesor o instructor en diabetes para identificar estrategias para controlar el estrs y cualquier desafo emocional y social. Preguntas para hacerle al mdico  Es necesario que consulte a Radio broadcast assistant en el cuidado de la diabetes?  Es necesario que me rena con un nutricionista?  A qu nmero puedo llamar si tengo preguntas?  Cules son los mejores momentos para controlar la glucemia? Dnde encontrar ms informacin:  Asociacin Estadounidense de la Diabetes (American Diabetes Association): diabetes.org  Academia de Nutricin y Information systems manager (Academy of Nutrition and Dietetics): www.eatright.[place] Diabetes y las Enfermedades Digestivas y [facility] of Diabetes and Digestive and Kidney Diseases, NIH): [employer].dk Resumen  Un plan de  alimentacin saludable lo ayudar a Scientist, physiological glucemia y Pharmacologist un estilo de vida saludable.  Trabajar con un especialista en dietas y nutricin (nutricionista) puede ayudarlo a Designer, television/film set de alimentacin para usted.  Tenga en cuenta  que los carbohidratos (hidratos de carbono) y el alcohol tienen efectos inmediatos en sus niveles de glucemia. Es importante contar los carbohidratos que ingiere y consumir alcohol con prudencia. Esta informacin no tiene Theme park manager el consejo del mdico. Asegrese de hacerle al mdico cualquier pregunta que tenga. Document Revised: 04/24/2017 Document Reviewed: 12/04/2016 Elsevier Patient Education  2020 [employer].

## 2019-09-24 ENCOUNTER — Telehealth: Payer: Self-pay

## 2019-09-24 DIAGNOSIS — I10 Essential (primary) hypertension: Secondary | ICD-10-CM

## 2019-09-24 MED ORDER — LISINOPRIL 40 MG PO TABS: 40.0000 mg | ORAL_TABLET | Freq: Every day | ORAL | 1 refills | Status: DC

## 2019-09-24 NOTE — Telephone Encounter (Signed)
Pt called requesting Lisinopril 40 mg and Lancets refills to be sent to his [facility]. He forgot to mention to you on yesterday visit.  Thank you.

## 2019-09-25 ENCOUNTER — Telehealth: Payer: Self-pay

## 2019-09-25 NOTE — Telephone Encounter (Signed)
Called patient made aware of medication refill sent to the  preferred pharmacy.

## 2019-10-24 ENCOUNTER — Other Ambulatory Visit: Payer: Self-pay

## 2019-10-24 ENCOUNTER — Ambulatory Visit (INDEPENDENT_AMBULATORY_CARE_PROVIDER_SITE_OTHER): Payer: Medicare Other

## 2019-10-24 VITALS — BP 148/76 | HR 69 | Temp 97.5°F

## 2019-10-24 DIAGNOSIS — I10 Essential (primary) hypertension: Secondary | ICD-10-CM | POA: Diagnosis not present

## 2019-10-24 NOTE — Progress Notes (Signed)
Patient here for BP check. Patient has taken BP medication this morning. After sitting BP was 148/76, pulse was 69. Spoke with provider & she states to have him continue current medications and return as scheduled on 01/19/2020. KWalker, CMA.

## 2019-11-03 ENCOUNTER — Encounter: Payer: Medicare Other | Attending: Internal Medicine | Admitting: Registered"

## 2019-11-21 DIAGNOSIS — K219 Gastro-esophageal reflux disease without esophagitis: Secondary | ICD-10-CM | POA: Diagnosis not present

## 2019-11-21 DIAGNOSIS — R5383 Other fatigue: Secondary | ICD-10-CM | POA: Diagnosis not present

## 2019-11-21 DIAGNOSIS — Z0000 Encounter for general adult medical examination without abnormal findings: Secondary | ICD-10-CM | POA: Diagnosis not present

## 2019-11-21 DIAGNOSIS — R82998 Other abnormal findings in urine: Secondary | ICD-10-CM | POA: Diagnosis not present

## 2019-11-21 DIAGNOSIS — E559 Vitamin D deficiency, unspecified: Secondary | ICD-10-CM | POA: Diagnosis not present

## 2019-11-21 DIAGNOSIS — Z79899 Other long term (current) drug therapy: Secondary | ICD-10-CM | POA: Diagnosis not present

## 2019-11-21 DIAGNOSIS — E118 Type 2 diabetes mellitus with unspecified complications: Secondary | ICD-10-CM | POA: Diagnosis not present

## 2019-11-21 DIAGNOSIS — I10 Essential (primary) hypertension: Secondary | ICD-10-CM | POA: Diagnosis not present

## 2019-11-21 DIAGNOSIS — Z1159 Encounter for screening for other viral diseases: Secondary | ICD-10-CM | POA: Diagnosis not present

## 2019-11-21 DIAGNOSIS — E7800 Pure hypercholesterolemia, unspecified: Secondary | ICD-10-CM | POA: Diagnosis not present

## 2019-11-21 DIAGNOSIS — R0602 Shortness of breath: Secondary | ICD-10-CM | POA: Diagnosis not present

## 2019-12-03 DIAGNOSIS — R9431 Abnormal electrocardiogram [ECG] [EKG]: Secondary | ICD-10-CM | POA: Diagnosis not present

## 2019-12-03 DIAGNOSIS — R0602 Shortness of breath: Secondary | ICD-10-CM | POA: Diagnosis not present

## 2019-12-08 DIAGNOSIS — R9431 Abnormal electrocardiogram [ECG] [EKG]: Secondary | ICD-10-CM | POA: Diagnosis not present

## 2019-12-11 DIAGNOSIS — R42 Dizziness and giddiness: Secondary | ICD-10-CM | POA: Diagnosis not present

## 2019-12-25 DIAGNOSIS — Z0182 Encounter for allergy testing: Secondary | ICD-10-CM | POA: Diagnosis not present

## 2019-12-25 DIAGNOSIS — R140 Abdominal distension (gaseous): Secondary | ICD-10-CM | POA: Diagnosis not present

## 2020-01-19 ENCOUNTER — Telehealth: Payer: Medicare Other | Admitting: Internal Medicine

## 2020-01-22 ENCOUNTER — Other Ambulatory Visit: Payer: Medicare Other

## 2020-02-04 DIAGNOSIS — R1013 Epigastric pain: Secondary | ICD-10-CM | POA: Diagnosis not present

## 2020-02-04 DIAGNOSIS — E1165 Type 2 diabetes mellitus with hyperglycemia: Secondary | ICD-10-CM | POA: Diagnosis not present

## 2020-02-04 DIAGNOSIS — R5383 Other fatigue: Secondary | ICD-10-CM | POA: Diagnosis not present

## 2020-02-04 DIAGNOSIS — Z01818 Encounter for other preprocedural examination: Secondary | ICD-10-CM | POA: Diagnosis not present

## 2020-02-04 DIAGNOSIS — Z1159 Encounter for screening for other viral diseases: Secondary | ICD-10-CM | POA: Diagnosis not present

## 2020-02-04 DIAGNOSIS — R945 Abnormal results of liver function studies: Secondary | ICD-10-CM | POA: Diagnosis not present

## 2020-02-04 DIAGNOSIS — Z1211 Encounter for screening for malignant neoplasm of colon: Secondary | ICD-10-CM | POA: Diagnosis not present

## 2020-02-06 ENCOUNTER — Other Ambulatory Visit: Payer: Self-pay | Admitting: *Deleted

## 2020-02-06 DIAGNOSIS — R945 Abnormal results of liver function studies: Secondary | ICD-10-CM | POA: Diagnosis not present

## 2020-02-06 DIAGNOSIS — I6529 Occlusion and stenosis of unspecified carotid artery: Secondary | ICD-10-CM

## 2020-02-10 ENCOUNTER — Encounter: Payer: Medicare Other | Admitting: Vascular Surgery

## 2020-02-10 ENCOUNTER — Encounter (HOSPITAL_COMMUNITY): Payer: Medicare Other

## 2020-02-12 DIAGNOSIS — R945 Abnormal results of liver function studies: Secondary | ICD-10-CM | POA: Diagnosis not present

## 2020-02-17 DIAGNOSIS — Z79899 Other long term (current) drug therapy: Secondary | ICD-10-CM | POA: Diagnosis not present

## 2020-02-17 DIAGNOSIS — I10 Essential (primary) hypertension: Secondary | ICD-10-CM | POA: Diagnosis not present

## 2020-02-17 DIAGNOSIS — E7800 Pure hypercholesterolemia, unspecified: Secondary | ICD-10-CM | POA: Diagnosis not present

## 2020-02-17 DIAGNOSIS — E118 Type 2 diabetes mellitus with unspecified complications: Secondary | ICD-10-CM | POA: Diagnosis not present

## 2020-02-17 DIAGNOSIS — J22 Unspecified acute lower respiratory infection: Secondary | ICD-10-CM | POA: Diagnosis not present

## 2020-02-25 DIAGNOSIS — K219 Gastro-esophageal reflux disease without esophagitis: Secondary | ICD-10-CM | POA: Diagnosis not present

## 2020-02-25 DIAGNOSIS — T3695XA Adverse effect of unspecified systemic antibiotic, initial encounter: Secondary | ICD-10-CM | POA: Diagnosis not present

## 2020-02-25 DIAGNOSIS — K521 Toxic gastroenteritis and colitis: Secondary | ICD-10-CM | POA: Diagnosis not present

## 2020-03-05 DIAGNOSIS — N281 Cyst of kidney, acquired: Secondary | ICD-10-CM | POA: Diagnosis not present

## 2020-03-05 DIAGNOSIS — R1031 Right lower quadrant pain: Secondary | ICD-10-CM | POA: Diagnosis not present

## 2020-03-05 DIAGNOSIS — J069 Acute upper respiratory infection, unspecified: Secondary | ICD-10-CM | POA: Diagnosis not present

## 2020-03-09 ENCOUNTER — Other Ambulatory Visit: Payer: Self-pay

## 2020-03-09 DIAGNOSIS — I6529 Occlusion and stenosis of unspecified carotid artery: Secondary | ICD-10-CM

## 2020-03-16 ENCOUNTER — Ambulatory Visit: Payer: Medicare Other | Admitting: Vascular Surgery

## 2020-03-16 ENCOUNTER — Encounter (HOSPITAL_COMMUNITY): Payer: Medicare Other

## 2020-03-18 DIAGNOSIS — R3129 Other microscopic hematuria: Secondary | ICD-10-CM | POA: Diagnosis not present

## 2020-03-24 DIAGNOSIS — K5790 Diverticulosis of intestine, part unspecified, without perforation or abscess without bleeding: Secondary | ICD-10-CM | POA: Diagnosis not present

## 2020-03-24 DIAGNOSIS — Z9109 Other allergy status, other than to drugs and biological substances: Secondary | ICD-10-CM | POA: Diagnosis not present

## 2020-03-24 DIAGNOSIS — R109 Unspecified abdominal pain: Secondary | ICD-10-CM | POA: Diagnosis not present

## 2020-03-31 DIAGNOSIS — Z1211 Encounter for screening for malignant neoplasm of colon: Secondary | ICD-10-CM | POA: Diagnosis not present

## 2020-03-31 DIAGNOSIS — E119 Type 2 diabetes mellitus without complications: Secondary | ICD-10-CM | POA: Diagnosis not present

## 2020-03-31 DIAGNOSIS — Z01818 Encounter for other preprocedural examination: Secondary | ICD-10-CM | POA: Diagnosis not present

## 2020-06-30 DIAGNOSIS — Z79899 Other long term (current) drug therapy: Secondary | ICD-10-CM | POA: Diagnosis not present

## 2020-06-30 DIAGNOSIS — E118 Type 2 diabetes mellitus with unspecified complications: Secondary | ICD-10-CM | POA: Diagnosis not present

## 2020-06-30 DIAGNOSIS — Z20822 Contact with and (suspected) exposure to covid-19: Secondary | ICD-10-CM | POA: Diagnosis not present

## 2020-06-30 DIAGNOSIS — I10 Essential (primary) hypertension: Secondary | ICD-10-CM | POA: Diagnosis not present

## 2020-06-30 DIAGNOSIS — Z23 Encounter for immunization: Secondary | ICD-10-CM | POA: Diagnosis not present

## 2020-06-30 DIAGNOSIS — E611 Iron deficiency: Secondary | ICD-10-CM | POA: Diagnosis not present

## 2020-06-30 DIAGNOSIS — E7800 Pure hypercholesterolemia, unspecified: Secondary | ICD-10-CM | POA: Diagnosis not present

## 2020-07-26 ENCOUNTER — Other Ambulatory Visit: Payer: Self-pay

## 2020-07-26 ENCOUNTER — Ambulatory Visit (HOSPITAL_COMMUNITY)
Admission: EM | Admit: 2020-07-26 | Discharge: 2020-07-26 | Disposition: A | Discharge status: Home / Self Care | Payer: Medicare Other | Attending: Family Medicine | Admitting: Family Medicine

## 2020-07-26 ENCOUNTER — Encounter (HOSPITAL_COMMUNITY): Payer: Self-pay

## 2020-07-26 DIAGNOSIS — L089 Local infection of the skin and subcutaneous tissue, unspecified: Secondary | ICD-10-CM | POA: Diagnosis not present

## 2020-07-26 DIAGNOSIS — S60459A Superficial foreign body of unspecified finger, initial encounter: Secondary | ICD-10-CM

## 2020-07-26 MED ORDER — BACITRACIN ZINC 500 UNIT/GM EX OINT
TOPICAL_OINTMENT | CUTANEOUS | Status: AC
  Filled 2020-07-26: qty 0.9

## 2020-07-26 NOTE — Discharge Instructions (Addendum)
Warm soaks 2 x a day Return as needed

## 2020-07-26 NOTE — ED Triage Notes (Signed)
Pt presents with pain and swelling in the right ring finger x 3 days. Reports he was cleaning the garage and a splinter got stuck in the right ringer finger. States part of the splinter is in the finger.

## 2020-07-26 NOTE — ED Provider Notes (Signed)
[facility]    CSN: [id number] Arrival date & time: 07/26/20  1837      History   Chief Complaint Chief Complaint  Patient presents with   Foreign Body in Skin    HPI Josephanthony Tindel is a 69 y.o. male.   HPI  Patient has a splinter in his finger.  Its been there for 3 days.  He states that he was cleaning the garage and it got caught on his workbench.  Right ring finger.  He is certain there is still part of the splinter in his finger.  Past Medical History:  Diagnosis Date   Allergy    Benign paroxysmal positional vertigo    Diabetes mellitus without complication (HCC)    GERD (gastroesophageal reflux disease)    Hyperlipidemia    Hypertension     Patient Active Problem List   Diagnosis Date Noted   Class 1 obesity due to excess calories with serious comorbidity and body mass index (BMI) of 32.0 to 32.9 in adult 09/23/2019   Allergic rhinitis 03/28/2019   Essential hypertension 10/03/2018   Type 2 diabetes mellitus with hyperglycemia, without long-term current use of insulin (HCC) 10/03/2018   Dyslipidemia 10/03/2018   General weakness 10/03/2018   ED (erectile dysfunction) 01/22/2013   GERD (gastroesophageal reflux disease) 10/24/2012    Past Surgical History:  Procedure Laterality Date   COLONOSCOPY     11-12 yrs ago- normal per pt-pt doesnt remeber where he had the colonoscopy    HERNIA REPAIR         Home Medications    Prior to Admission medications   Medication Sig Start Date End Date Taking? Authorizing Provider  aspirin EC 81 MG tablet Take 81 mg by mouth daily.    [provider]  atorvastatin (LIPITOR) 40 MG tablet Take 1 tablet (40 mg total) by mouth daily. 01/30/19   Bing Neighbors, FNP  dapagliflozin propanediol (FARXIGA) 5 MG TABS tablet Take 5 mg by mouth daily before breakfast. 09/23/19   Marcine Matar, MD  Fish Oil OIL Take 1 capsule by mouth daily.    [provider]  fluticasone  (FLONASE) 50 MCG/ACT nasal spray Place 2 sprays into both nostrils daily. 09/23/19   Marcine Matar, MD  glucose blood test strip Use to check FSBS daily. Dx: E11.9 09/23/19   Marcine Matar, MD  Lancets MISC OneTouch Delica Lancets 33 gauge  check glucose levels 2-3 times a day DX: E11.65, NPI [id number], Date: 03/21/17 09/23/19   Marcine Matar, MD  lisinopril (ZESTRIL) 40 MG tablet Take 1 tablet (40 mg total) by mouth daily. 09/24/19   Marcine Matar, MD  metFORMIN (GLUCOPHAGE) 1000 MG tablet Take 1 tablet (1,000 mg total) by mouth 2 (two) times daily with a meal. 09/23/19   Marcine Matar, MD  montelukast (SINGULAIR) 10 MG tablet Take 1 tablet (10 mg total) by mouth at bedtime. 04/14/19   Fulp, Cammie, MD  Multiple Vitamin (MULTIVITAMIN) tablet Take 1 tablet by mouth daily.    [provider]  omeprazole (PRILOSEC) 20 MG capsule Take 1 capsule (20 mg total) by mouth daily. 04/14/19   Fulp, Cammie, MD  triamcinolone cream (KENALOG) 0.1 % Apply 1 application topically 2 (two) times daily. 04/29/19   Marcine Matar, MD    Family History Family History  Problem Relation Age of Onset   Colon cancer Neg Hx    Colon polyps Neg Hx    Esophageal cancer Neg  Hx    Rectal cancer Neg Hx    Stomach cancer Neg Hx     Social History Social History   Tobacco Use   Smoking status: Current Some Day Smoker   Smokeless tobacco: Never Used   Tobacco comment: 0-2/day  Vaping Use   Vaping Use: Never used  Substance Use Topics   Alcohol use: No   Drug use: No     Allergies   Patient has no known allergies.   Review of Systems Review of Systems  See HPI Physical Exam Triage Vital Signs ED Triage Vitals  Enc Vitals Group     BP 07/26/20 1926 (!) 161/87     Pulse Rate 07/26/20 1926 70     Resp 07/26/20 1926 20     Temp 07/26/20 1926 97.7 F (36.5 C)     Temp Source 07/26/20 1926 Oral     SpO2 07/26/20 1926 96 %     Weight --      Height --      Head  Circumference --      Peak Flow --      Pain Score 07/26/20 1925 5     Pain Loc --      Pain Edu? --      Excl. in GC? --    No data found.  Updated Vital Signs BP (!) 161/87 (BP Location: Left Arm)    Pulse 70    Temp 97.7 F (36.5 C) (Oral)    Resp 20    SpO2 96%      Physical Exam Constitutional:      General: He is not in acute distress.    Appearance: He is well-developed.  HENT:     Head: Normocephalic and atraumatic.  Eyes:     Conjunctiva/sclera: Conjunctivae normal.     Pupils: Pupils are equal, round, and reactive to light.  Cardiovascular:     Rate and Rhythm: Normal rate.  Pulmonary:     Effort: Pulmonary effort is normal. No respiratory distress.  Abdominal:     General: There is no distension.     Palpations: Abdomen is soft.  Musculoskeletal:        General: Normal range of motion.     Cervical back: Normal range of motion.  Skin:    General: Skin is warm and dry.     Comments: Type of right ring finger has thickened skin with purulence visible underneath  Neurological:     Mental Status: He is alert.  Psychiatric:        Mood and Affect: Mood normal.        Behavior: Behavior normal.   Finger soaked in antibacterial soap and water.  I then took a 11 blade scalpel and debrided the thick skin on the tip of the finger.  Purulence escaped.  I was able to see the tip of the splinter.  I removed 1/4 inch of wood splinter from the finger.  Immediate relief   UC Treatments / Results  Labs (all labs ordered are listed, but only abnormal results are displayed) Labs Reviewed - No data to display  EKG   Radiology No results found.  Procedures Procedures (including critical care time)  Medications Ordered in UC Medications - No data to display  Initial Impression / Assessment and Plan / UC Course  I have reviewed the triage vital signs and the nursing notes.  Pertinent labs & imaging results that were available during my care of the patient were  reviewed by me and considered in my medical decision making (see chart for details).     Discussed infection ans wound care Final Clinical Impressions(s) / UC Diagnoses   Final diagnoses:  Finger, superficial foreign body (splinter), infected, initial encounter     Discharge Instructions     Warm soaks 2 x a day Return as needed    ED Prescriptions    None     PDMP not reviewed this encounter.   Eustace Moore, MD 07/26/20 2037

## 2020-07-27 ENCOUNTER — Ambulatory Visit (INDEPENDENT_AMBULATORY_CARE_PROVIDER_SITE_OTHER): Payer: Medicare Other | Admitting: Emergency Medicine

## 2020-07-27 ENCOUNTER — Encounter: Payer: Self-pay | Admitting: Emergency Medicine

## 2020-07-27 ENCOUNTER — Other Ambulatory Visit: Payer: Self-pay

## 2020-07-27 VITALS — BP 166/75 | HR 69 | Temp 99.0°F | Resp 16 | Ht 63.0 in | Wt 186.0 lb

## 2020-07-27 DIAGNOSIS — I152 Hypertension secondary to endocrine disorders: Secondary | ICD-10-CM | POA: Diagnosis not present

## 2020-07-27 DIAGNOSIS — E1169 Type 2 diabetes mellitus with other specified complication: Secondary | ICD-10-CM | POA: Diagnosis not present

## 2020-07-27 DIAGNOSIS — E1159 Type 2 diabetes mellitus with other circulatory complications: Secondary | ICD-10-CM | POA: Diagnosis not present

## 2020-07-27 DIAGNOSIS — E6609 Other obesity due to excess calories: Secondary | ICD-10-CM

## 2020-07-27 DIAGNOSIS — E785 Hyperlipidemia, unspecified: Secondary | ICD-10-CM

## 2020-07-27 DIAGNOSIS — I10 Essential (primary) hypertension: Secondary | ICD-10-CM

## 2020-07-27 DIAGNOSIS — Z6832 Body mass index (BMI) 32.0-32.9, adult: Secondary | ICD-10-CM

## 2020-07-27 DIAGNOSIS — E1165 Type 2 diabetes mellitus with hyperglycemia: Secondary | ICD-10-CM

## 2020-07-27 LAB — COMPREHENSIVE METABOLIC PANEL
ALT: 30 IU/L (ref 0–44)
AST: 21 IU/L (ref 0–40)
Albumin/Globulin Ratio: 1.7 (ref 1.2–2.2)
Albumin: 4.3 g/dL (ref 3.8–4.8)
Alkaline Phosphatase: 108 IU/L (ref 44–121)
BUN/Creatinine Ratio: 24 (ref 10–24)
BUN: 18 mg/dL (ref 8–27)
Bilirubin Total: 0.3 mg/dL (ref 0.0–1.2)
CO2: 22 mmol/L (ref 20–29)
Calcium: 9.7 mg/dL (ref 8.6–10.2)
Chloride: 103 mmol/L (ref 96–106)
Creatinine, Ser: 0.75 mg/dL — ABNORMAL LOW (ref 0.76–1.27)
GFR calc Af Amer: 110 mL/min/{1.73_m2} (ref >59)
GFR calc non Af Amer: 95 mL/min/{1.73_m2} (ref >59)
Globulin, Total: 2.6 g/dL (ref 1.5–4.5)
Glucose: 147 mg/dL — ABNORMAL HIGH (ref 65–99)
Potassium: 4.4 mmol/L (ref 3.5–5.2)
Sodium: 139 mmol/L (ref 134–144)
Total Protein: 6.9 g/dL (ref 6.0–8.5)

## 2020-07-27 LAB — LIPID PANEL
Chol/HDL Ratio: 3.7 ratio (ref 0.0–5.0)
Cholesterol, Total: 103 mg/dL (ref 100–199)
HDL: 28 mg/dL — ABNORMAL LOW (ref >39)
LDL Chol Calc (NIH): 46 mg/dL (ref 0–99)
Triglycerides: 171 mg/dL — ABNORMAL HIGH (ref 0–149)
VLDL Cholesterol Cal: 29 mg/dL (ref 5–40)

## 2020-07-27 LAB — POCT GLYCOSYLATED HEMOGLOBIN (HGB A1C): Hemoglobin A1C: 8.3 % — AB (ref 4.0–5.6)

## 2020-07-27 LAB — GLUCOSE, POCT (MANUAL RESULT ENTRY): POC Glucose: 153 mg/dl — AB (ref 70–99)

## 2020-07-27 MED ORDER — LOSARTAN POTASSIUM-HCTZ 50-12.5 MG PO TABS: 1.0000 | ORAL_TABLET | Freq: Every day | ORAL | 3 refills | Status: DC

## 2020-07-27 MED ORDER — ATORVASTATIN CALCIUM 40 MG PO TABS
40.0000 mg | ORAL_TABLET | Freq: Every day | ORAL | 3 refills | Status: DC
Start: 2020-07-27 — End: 2020-09-07

## 2020-07-27 MED ORDER — TRULICITY 0.75 MG/0.5ML ~~LOC~~ SOAJ: 0.7500 mg | SUBCUTANEOUS | 5 refills | Status: DC

## 2020-07-27 MED ORDER — METFORMIN HCL 1000 MG PO TABS: 1000.0000 mg | ORAL_TABLET | Freq: Two times a day (BID) | ORAL | 3 refills | Status: DC

## 2020-07-27 NOTE — Assessment & Plan Note (Signed)
Elevated systolic blood pressure in the office and at home. Will change lisinopril to losartan/HCTZ 50-12.5 mg daily. Uncontrolled diabetes with hemoglobin A1c at 8.3. Continue Metformin 1000 mg twice a day. We will start weekly Trulicity. Diet and nutrition discussed. Follow-up in 3 months.

## 2020-07-27 NOTE — Progress Notes (Signed)
Patrick Park 70 y.o.   Chief Complaint  Patient presents with  . Diabetes    per patient check sugar  . Medication Refill    Lisinopril, Metformin and Omeprazole    HISTORY OF PRESENT ILLNESS: This is a 69 y.o. male with history of hypertension diabetes here for follow-up and medication refill. #1 diabetes: On Metformin 1000 mg twice a day and Farxiga 5 mg daily. #2 hypertension: On lisinopril 40 mg daily. Elevated systolic blood pressures at home. Also takes 1 baby aspirin daily and atorvastatin 40 mg daily for dyslipidemia. Has no complaints or medical concerns today.  HPI   Prior to Admission medications   Medication Sig Start Date End Date Taking? Authorizing Provider  aspirin EC 81 MG tablet Take 81 mg by mouth daily.   Yes [provider]  atorvastatin (LIPITOR) 40 MG tablet Take 1 tablet (40 mg total) by mouth daily. 01/30/19  Yes Bing Neighbors, FNP  dapagliflozin propanediol (FARXIGA) 5 MG TABS tablet Take 5 mg by mouth daily before breakfast. 09/23/19  Yes Marcine Matar, MD  Fish Oil OIL Take 1 capsule by mouth daily.   Yes [provider]  lisinopril (ZESTRIL) 40 MG tablet Take 1 tablet (40 mg total) by mouth daily. 09/24/19  Yes Marcine Matar, MD  metFORMIN (GLUCOPHAGE) 1000 MG tablet Take 1 tablet (1,000 mg total) by mouth 2 (two) times daily with a meal. 09/23/19  Yes Marcine Matar, MD  Multiple Vitamin (MULTIVITAMIN) tablet Take 1 tablet by mouth daily.   Yes [provider]  omeprazole (PRILOSEC) 20 MG capsule Take 1 capsule (20 mg total) by mouth daily. 04/14/19  Yes Fulp, Cammie, MD  triamcinolone cream (KENALOG) 0.1 % Apply 1 application topically 2 (two) times daily. 04/29/19  Yes Marcine Matar, MD  fluticasone (FLONASE) 50 MCG/ACT nasal spray Place 2 sprays into both nostrils daily. Patient not taking: Reported on 07/27/2020 09/23/19   Marcine Matar, MD  glucose blood test strip Use to check FSBS daily. Dx:  E11.9 09/23/19   Marcine Matar, MD  Lancets MISC OneTouch Delica Lancets 33 gauge  check glucose levels 2-3 times a day DX: E11.65, NPI [id number], Date: 03/21/17 09/23/19   Marcine Matar, MD  montelukast (SINGULAIR) 10 MG tablet Take 1 tablet (10 mg total) by mouth at bedtime. Patient not taking: Reported on 07/27/2020 04/14/19   Cain Saupe, MD    No Known Allergies  Patient Active Problem List   Diagnosis Date Noted  . Class 1 obesity due to excess calories with serious comorbidity and body mass index (BMI) of 32.0 to 32.9 in adult 09/23/2019  . Allergic rhinitis 03/28/2019  . Essential hypertension 10/03/2018  . Type 2 diabetes mellitus with hyperglycemia, without long-term current use of insulin (HCC) 10/03/2018  . Dyslipidemia 10/03/2018  . General weakness 10/03/2018  . ED (erectile dysfunction) 01/22/2013  . GERD (gastroesophageal reflux disease) 10/24/2012    Past Medical History:  Diagnosis Date  . Allergy   . Benign paroxysmal positional vertigo   . Diabetes mellitus without complication (HCC)   . GERD (gastroesophageal reflux disease)   . Hyperlipidemia   . Hypertension     Past Surgical History:  Procedure Laterality Date  . COLONOSCOPY     11-12 yrs ago- normal per pt-pt doesnt remeber where he had the colonoscopy   . HERNIA REPAIR      Social History   Socioeconomic History  . Marital status: Married  Spouse name: Not on file  . Number of children: Not on file  . Years of education: Not on file  . Highest education level: Not on file  Occupational History  . Not on file  Tobacco Use  . Smoking status: Current Some Day Smoker  . Smokeless tobacco: Never Used  . Tobacco comment: 0-2/day  Vaping Use  . Vaping Use: Never used  Substance and Sexual Activity  . Alcohol use: No  . Drug use: No  . Sexual activity: Not on file  Other Topics Concern  . Not on file  Social History Narrative  . Not on file   Social Determinants of Health    Financial Resource Strain:   . Difficulty of Paying Living Expenses: Not on file  Food Insecurity:   . Worried About Programme researcher, broadcasting/film/video in the Last Year: Not on file  . Ran Out of Food in the Last Year: Not on file  Transportation Needs:   . Lack of Transportation (Medical): Not on file  . Lack of Transportation (Non-Medical): Not on file  Physical Activity:   . Days of Exercise per Week: Not on file  . Minutes of Exercise per Session: Not on file  Stress:   . Feeling of Stress : Not on file  Social Connections:   . Frequency of Communication with Friends and Family: Not on file  . Frequency of Social Gatherings with Friends and Family: Not on file  . Attends Religious Services: Not on file  . Active Member of Clubs or Organizations: Not on file  . Attends Banker Meetings: Not on file  . Marital Status: Not on file  Intimate Partner Violence:   . Fear of Current or Ex-Partner: Not on file  . Emotionally Abused: Not on file  . Physically Abused: Not on file  . Sexually Abused: Not on file    Family History  Problem Relation Age of Onset  . Colon cancer Neg Hx   . Colon polyps Neg Hx   . Esophageal cancer Neg Hx   . Rectal cancer Neg Hx   . Stomach cancer Neg Hx      Review of Systems  Constitutional: Negative.  Negative for chills and fever.  HENT: Negative.  Negative for congestion and sore throat.   Respiratory: Negative.  Negative for cough and shortness of breath.   Cardiovascular: Negative.  Negative for chest pain and palpitations.  Gastrointestinal: Negative.  Negative for nausea and vomiting.  Genitourinary: Negative.  Negative for dysuria.  Musculoskeletal: Negative.   Skin: Negative.  Negative for rash.       Capillary fragility  Neurological: Negative.  Negative for dizziness and headaches.  All other systems reviewed and are negative.  Today's Vitals   07/27/20 0924  BP: (!) 166/75  Pulse: 69  Resp: 16  Temp: 99 F (37.2 C)   TempSrc: Temporal  SpO2: 98%  Weight: 186 lb (84.4 kg)  Height: 5\' 3"  (1.6 m)   Body mass index is 32.95 kg/m. Wt Readings from Last 3 Encounters:  07/27/20 186 lb (84.4 kg)  09/23/19 188 lb (85.3 kg)  01/23/19 183 lb 9.6 oz (83.3 kg)     Physical Exam Vitals reviewed.  Constitutional:      Appearance: Normal appearance. He is obese.  HENT:     Head: Normocephalic.     Mouth/Throat:     Mouth: Mucous membranes are moist.     Pharynx: Oropharynx is clear.  Eyes:  Extraocular Movements: Extraocular movements intact.     Pupils: Pupils are equal, round, and reactive to light.  Cardiovascular:     Rate and Rhythm: Normal rate and regular rhythm.     Pulses: Normal pulses.     Heart sounds: Normal heart sounds.  Pulmonary:     Effort: Pulmonary effort is normal.     Breath sounds: Normal breath sounds.  Abdominal:     Palpations: Abdomen is soft.     Tenderness: There is no abdominal tenderness.  Musculoskeletal:        General: Normal range of motion.     Cervical back: Normal range of motion and neck supple.  Skin:    General: Skin is warm and dry.     Capillary Refill: Capillary refill takes less than 2 seconds.  Neurological:     General: No focal deficit present.     Mental Status: He is alert and oriented to person, place, and time.  Psychiatric:        Mood and Affect: Mood normal.        Behavior: Behavior normal.     Results for orders placed or performed in visit on 07/27/20 (from the past 24 hour(s))  POCT glucose (manual entry)     Status: Abnormal   Collection Time: 07/27/20  9:36 AM  Result Value Ref Range   POC Glucose 153 (A) 70 - 99 mg/dl  POCT glycosylated hemoglobin (Hb A1C)     Status: Abnormal   Collection Time: 07/27/20  9:41 AM  Result Value Ref Range   Hemoglobin A1C 8.3 (A) 4.0 - 5.6 %   HbA1c POC (<> result, manual entry)     HbA1c, POC (prediabetic range)     HbA1c, POC (controlled diabetic range)      A total of 45 minutes  was spent with the patient, greater than 50% of which was in counseling/coordination of care regarding uncontrolled diabetes and cardiovascular risks associated with this condition, review of all medications and changes made today, review of most recent blood work results including today's hemoglobin A1c, review of most recent office visit notes, education on nutrition, prognosis, health maintenance items, documentation and need for follow-up in 3 months.  ASSESSMENT & PLAN: Hypertension associated with diabetes (HCC) Elevated systolic blood pressure in the office and at home. Will change lisinopril to losartan/HCTZ 50-12.5 mg daily. Uncontrolled diabetes with hemoglobin A1c at 8.3. Continue Metformin 1000 mg twice a day. We will start weekly Trulicity. Diet and nutrition discussed. Follow-up in 3 months.  Ark was seen today for diabetes and medication refill.  Diagnoses and all orders for this visit:  Type 2 diabetes mellitus with hyperglycemia, without long-term current use of insulin (HCC) -     POCT glucose (manual entry) -     POCT glycosylated hemoglobin (Hb A1C) -     HM Diabetes Foot Exam -     metFORMIN (GLUCOPHAGE) 1000 MG tablet; Take 1 tablet (1,000 mg total) by mouth 2 (two) times daily with a meal. -     Dulaglutide (TRULICITY) 0.75 MG/0.5ML SOPN; Inject 0.75 mg into the skin once a week.  Essential hypertension -     losartan-hydrochlorothiazide (HYZAAR) 50-12.5 MG tablet; Take 1 tablet by mouth daily.  Hypertension associated with diabetes (HCC) -     Comprehensive metabolic panel  Dyslipidemia associated with type 2 diabetes mellitus (HCC) -     Lipid panel  Class 1 obesity due to excess calories with serious comorbidity and body  mass index (BMI) of 32.0 to 32.9 in adult  Other orders -     atorvastatin (LIPITOR) 40 MG tablet; Take 1 tablet (40 mg total) by mouth daily.    Patient Instructions       If you have lab work done today you will be contacted  with your lab results within the next 2 weeks.  If you have not heard from us then please contact us. The fastest way to get your results is to register for My Chart.   IF you received an x-ray today, you will receive an invoice from [facility]. Please contact [facility] at [phone] with questions or concerns regarding your invoice.   IF you received labwork today, you will receive an invoice from [facility]. Please contact [facility] at [phone] with questions or concerns regarding your invoice.   Our billing staff will not be able to assist you with questions regarding bills from these companies.  You will be contacted with the lab results as soon as they are available. The fastest way to get your results is to activate your My Chart account. Instructions are located on the last page of this paperwork. If you have not heard from us regarding the results in 2 weeks, please contact this office.     Diabetes mellitus y nutricin, en adultos Diabetes Mellitus and Nutrition, Adult Si sufre de diabetes (diabetes mellitus), es muy importante tener hbitos alimenticios saludables debido a que sus niveles de Psychologist, counsellingazcar en la sangre (glucosa) se ven afectados en gran medida por lo que come y bebe. [place] alimentos saludables en las cantidades [place]cuadas, aproximadamente a la Smith Internationalmisma hora todos los das, [place] ayudar a:  Scientist, physiologicalControlar la glucemia.  Disminuir el riesgo de sufrir una enfermedad cardaca.  Mejorar la presin arterial.  BaristaAlcanzar o mantener un peso saludable. Todas las personas que sufren de diabetes son diferentes y cada una tiene necesidades diferentes en cuanto a un plan de alimentacin. El mdico puede recomendarle que trabaje con un especialista en dietas y nutricin (nutricionista) para Tax adviserelaborar el mejor plan para usted. Su plan de alimentacin puede variar segn factores como:  Las caloras que necesita.  Los medicamentos que toma.  Su peso.  Sus niveles de  glucemia, presin arterial y colesterol.  Su nivel de [place]actividad.  Otras afecciones que tenga, como enfermedades cardacas o renales. Cmo me afectan los carbohidratos? Los carbohidratos, o hidratos de carbono, afectan su nivel de glucemia ms que cualquier otro tipo de alimento. La ingesta de carbohidratos naturalmente aumenta la cantidad de CarMaxglucosa en la sangre. El recuento de carbohidratos es un mtodo destinado a Midwifellevar un registro de la cantidad de carbohidratos que se consumen. El recuento de carbohidratos es importante para Pharmacologistmantener la glucemia a un nivel saludable, especialmente si utiliza insulina o toma determinados medicamentos por va oral para la diabetes. Es importante conocer la cantidad de carbohidratos que se pueden ingerir en cada comida sin correr Surveyor, mineralsningn riesgo. Esto es Government social research officerdiferente en cada persona. Su nutricionista puede ayudarlo a calcular la cantidad de carbohidratos que debe ingerir en cada comida y en cada refrigerio. Entre los alimentos que contienen carbohidratos, se incluyen:  Pan, cereal, arroz, pastas y galletas.  Papas y maz.  Guisantes, frijoles y lentejas.  Leche y Dentistyogur.  Nils PyleFrutas y Sloveniajugo.  Postres, como pasteles, galletas, helado y caramelos. Cmo me afecta el alcohol? El alcohol puede provocar disminuciones sbitas de la glucemia (hipoglucemia), especialmente si utiliza insulina o toma determinados medicamentos por va oral para la diabetes. La  hipoglucemia es Investment banker, corporate mortal. Los sntomas de la hipoglucemia (somnolencia, mareos y confusin) son similares a los sntomas de haber consumido demasiado alcohol. Si el mdico afirma que el alcohol es seguro para usted, [place] estas pautas:  Limite el consumo de alcohol a no ms de por da si es mujer y no est [place], y a si es hombre. Una medida equivale a 12oz ( ) de cerveza, 5oz ( ) de vino o 1oz (44ml) de bebidas alcohlicas de alta graduacin.  No beba con  el estmago vaco.  Mantngase hidratado bebiendo agua, refrescos dietticos o t helado sin azcar.  Tenga en cuenta que los refrescos comunes, los jugos y otras bebida para Engineer, manufacturing pueden contener mucha azcar y se deben contar como carbohidratos. Cules son algunos consejos para seguir este plan?  Leer las etiquetas de los alimentos  Comience por leer el tamao de la porcin en la "Informacin nutricional" en las etiquetas de los alimentos envasados y las bebidas. La cantidad de caloras, carbohidratos, grasas y otros nutrientes mencionados en la etiqueta se basan en una porcin del alimento. Muchos alimentos contienen ms de una porcin por envase.  Verifique la cantidad total de gramos (g) de carbohidratos totales en una porcin. Puede calcular la cantidad de porciones de carbohidratos al dividir el total de carbohidratos por 15. Por ejemplo, si un alimento tiene un total de 30g de carbohidratos, equivale a 2 porciones de carbohidratos.  Verifique la cantidad de gramos (g) de grasas saturadas y grasas trans en una porcin. Escoja alimentos que no contengan grasa o que tengan un bajo contenido.  Verifique la cantidad de miligramos (mg) de sal (sodio) en una porcin. La [employer] de las personas deben limitar la ingesta de sodio total a menos de  por Futures trader.  Siempre consulte la informacin nutricional de los alimentos etiquetados como "con bajo contenido de grasa" o "sin grasa". Estos alimentos pueden tener un mayor contenido de International aid/development worker agregada o carbohidratos refinados, y deben evitarse.  Hable con su nutricionista para identificar sus objetivos diarios en cuanto a los nutrientes mencionados en la etiqueta. Al ir de compras  Evite comprar alimentos procesados, enlatados o precocinados. Estos alimentos tienden a Counselling psychologist mayor cantidad de [place], sodio y azcar agregada.  Compre en la zona exterior de la tienda de comestibles. Esta zona incluye frutas y verduras frescas, granos a  granel, carnes frescas y productos lcteos frescos. Al cocinar  Utilice mtodos de coccin a baja temperatura, como hornear, en lugar de mtodos de coccin a alta temperatura, como frer en abundante aceite.  Cocine con aceites saludables, como el aceite de [place], canola o [place].  Evite cocinar con manteca, crema o carnes con alto contenido de grasa. Planificacin de las comidas  Coma las comidas y los refrigerios regularmente, preferentemente a la misma hora todos [place]. Evite pasar largos perodos de tiempo sin comer.  Consuma alimentos ricos en fibra, como frutas frescas, verduras, frijoles y cereales integrales. Consulte a su nutricionista sobre cuntas porciones de carbohidratos puede consumir en cada comida.  Consuma entre 4 y 6 onzas (oz) de protenas magras por da, como carnes [place], pollo, pescado, huevos o tofu. Una onza de protena magra equivale a: ? 1 onza de carne, pollo o pescado. ? 1huevo. ?  taza de tofu.  Coma algunos alimentos por da que contengan grasas saludables, como aguacates, frutos secos, semillas y pescado. Estilo de vida  Controle su nivel de glucemia con regularidad.  Haga actividad fsica habitualmente como se lo  haya indicado el mdico. Esto puede incluir lo siguiente: ? semanales de ejercicio de intensidad moderada o alta. Esto podra incluir caminatas dinmicas, ciclismo o gimnasia acutica. ? Realizar ejercicios de elongacin y de fortalecimiento, como yoga o levantamiento de pesas, por lo menos 2veces por semana.  Tome los [employer] se lo haya indicado el mdico.  No consuma ningn producto que contenga nicotina o tabaco, como cigarrillos y Administrator, Civil Service. Si necesita ayuda para dejar de fumar, consulte al CIGNA con un asesor o instructor en diabetes para identificar estrategias para controlar el estrs y cualquier desafo emocional y social. Preguntas para hacerle al mdico  Es necesario que  consulte a IT trainer en el cuidado de la diabetes?  Es necesario que me rena con un nutricionista?  A qu nmero puedo llamar si tengo preguntas?  Cules son los mejores momentos para controlar la glucemia? Dnde encontrar ms informacin:  Asociacin Estadounidense de la Diabetes (American Diabetes Association): diabetes.org  Academia de Nutricin y Pension scheme manager (Academy of Nutrition and Dietetics): www.eatright.org  [employer] de la Diabetes y las Enfermedades Digestivas y [facility] of Diabetes and Digestive and Kidney Diseases, NIH): CarFlippers.tn Resumen  Un plan de alimentacin saludable lo ayudar a Scientist, physiological glucemia y Pharmacologist un estilo de vida saludable.  Trabajar con un especialista en dietas y nutricin (nutricionista) puede ayudarlo a Designer, television/film set de alimentacin para usted.  Tenga en cuenta que los carbohidratos (hidratos de carbono) y el alcohol tienen efectos inmediatos en sus niveles de glucemia. Es importante contar los carbohidratos que ingiere y consumir alcohol con prudencia. Esta informacin no tiene Theme park manager el consejo del mdico. Asegrese de hacerle al mdico cualquier pregunta que tenga. Document Revised: 04/24/2017 Document Reviewed: 12/04/2016 Elsevier Patient Education  2020 Elsevier Inc.      Edwina Barth, MD [facility]

## 2020-07-27 NOTE — Patient Instructions (Addendum)
   If you have lab work done today you will be contacted with your lab results within the next 2 weeks.  If you have not heard from us then please contact us. The fastest way to get your results is to register for My Chart.   IF you received an x-ray today, you will receive an invoice from [facility]. Please contact [facility] at [phone] with questions or concerns regarding your invoice.   IF you received labwork today, you will receive an invoice from [facility]. Please contact [facility] at [phone] with questions or concerns regarding your invoice.   Our billing staff will not be able to assist you with questions regarding bills from these companies.  You will be contacted with the lab results as soon as they are available. The fastest way to get your results is to activate your My Chart account. Instructions are located on the last page of this paperwork. If you have not heard from us regarding the results in 2 weeks, please contact this office.     Diabetes mellitus y nutricin, en adultos Diabetes Mellitus and Nutrition, Adult Si sufre de diabetes (diabetes mellitus), es muy importante tener hbitos alimenticios saludables debido a que sus niveles de azcar en la sangre (glucosa) se ven afectados en gran medida por lo que come y bebe. [place]er alimentos saludables en las cantidades adecuadas, aproximadamente a la misma hora todos los das, lo ayudar a:  Controlar la glucemia.  Disminuir el riesgo de sufrir una enfermedad cardaca.  Mejorar la presin arterial.  Alcanzar o mantener un peso saludable. Todas las personas que sufren de diabetes son diferentes y cada una tiene necesidades diferentes en cuanto a un plan de alimentacin. El mdico puede recomendarle que trabaje con un especialista en dietas y nutricin (nutricionista) para elaborar el mejor plan para usted. Su plan de alimentacin puede variar segn factores como:  Las caloras que  necesita.  Los medicamentos que toma.  Su peso.  Sus niveles de glucemia, presin arterial y colesterol.  Su nivel de actividad.  Otras afecciones que tenga, como enfermedades cardacas o renales. Cmo me afectan los carbohidratos? Los carbohidratos, o hidratos de carbono, afectan su nivel de glucemia ms que cualquier otro tipo de alimento. La ingesta de carbohidratos naturalmente aumenta la cantidad de glucosa en la sangre. El recuento de carbohidratos es un mtodo destinado a llevar un registro de la cantidad de carbohidratos que se consumen. El recuento de carbohidratos es importante para mantener la glucemia a un nivel saludable, especialmente si utiliza insulina o toma determinados medicamentos por va oral para la diabetes. Es importante conocer la cantidad de carbohidratos que se pueden ingerir en cada comida sin correr ningn riesgo. Esto es diferente en cada persona. Su nutricionista puede ayudarlo a calcular la cantidad de carbohidratos que debe ingerir en cada comida y en cada refrigerio. Entre los alimentos que contienen carbohidratos, se incluyen:  Pan, cereal, arroz, pastas y galletas.  Papas y maz.  Guisantes, frijoles y lentejas.  Leche y yogur.  Frutas y jugo.  Postres, como pasteles, galletas, helado y caramelos. Cmo me afecta el alcohol? El alcohol puede provocar disminuciones sbitas de la glucemia (hipoglucemia), especialmente si utiliza insulina o toma determinados medicamentos por va oral para la diabetes. La hipoglucemia es una afeccin potencialmente mortal. Los sntomas de la hipoglucemia (somnolencia, mareos y confusin) son similares a los sntomas de haber consumido demasiado alcohol. Si el mdico afirma que el alcohol es seguro para usted, siga estas pautas:    Limite el consumo de alcohol a no ms de 1medida por da si es mujer y no est embarazada, y a 2medidas si es hombre. Una medida equivale a 12oz (355ml) de cerveza, 5oz (148ml) de vino o  1oz (44ml) de bebidas alcohlicas de alta graduacin.  No beba con el estmago vaco.  Mantngase hidratado bebiendo agua, refrescos dietticos o t helado sin azcar.  Tenga en cuenta que los refrescos comunes, los jugos y otras bebida para mezclar pueden contener mucha azcar y se deben contar como carbohidratos. Cules son algunos consejos para seguir este plan?  Leer las etiquetas de los alimentos  Comience por leer el tamao de la porcin en la "Informacin nutricional" en las etiquetas de los alimentos envasados y las bebidas. La cantidad de caloras, carbohidratos, grasas y otros nutrientes mencionados en la etiqueta se basan en una porcin del alimento. Muchos alimentos contienen ms de una porcin por envase.  Verifique la cantidad total de gramos (g) de carbohidratos totales en una porcin. Puede calcular la cantidad de porciones de carbohidratos al dividir el total de carbohidratos por 15. Por ejemplo, si un alimento tiene un total de 30g de carbohidratos, equivale a 2 porciones de carbohidratos.  Verifique la cantidad de gramos (g) de grasas saturadas y grasas trans en una porcin. Escoja alimentos que no contengan grasa o que tengan un bajo contenido.  Verifique la cantidad de miligramos (mg) de sal (sodio) en una porcin. La mayora de las personas deben limitar la ingesta de sodio total a menos de 2300mg por da.  Siempre consulte la informacin nutricional de los alimentos etiquetados como "con bajo contenido de grasa" o "sin grasa". Estos alimentos pueden tener un mayor contenido de azcar agregada o carbohidratos refinados, y deben evitarse.  Hable con su nutricionista para identificar sus objetivos diarios en cuanto a los nutrientes mencionados en la etiqueta. Al ir de compras  Evite comprar alimentos procesados, enlatados o precocinados. Estos alimentos tienden a tener una mayor cantidad de grasa, sodio y azcar agregada.  Compre en la zona exterior de la tienda de  comestibles. Esta zona incluye frutas y verduras frescas, granos a granel, carnes frescas y productos lcteos frescos. Al cocinar  Utilice mtodos de coccin a baja temperatura, como hornear, en lugar de mtodos de coccin a alta temperatura, como frer en abundante aceite.  Cocine con aceites saludables, como el aceite de oliva, canola o girasol.  Evite cocinar con manteca, crema o carnes con alto contenido de grasa. Planificacin de las comidas  Coma las comidas y los refrigerios regularmente, preferentemente a la misma hora todos los das. Evite pasar largos perodos de tiempo sin comer.  Consuma alimentos ricos en fibra, como frutas frescas, verduras, frijoles y cereales integrales. Consulte a su nutricionista sobre cuntas porciones de carbohidratos puede consumir en cada comida.  Consuma entre 4 y 6 onzas (oz) de protenas magras por da, como carnes magras, pollo, pescado, huevos o tofu. Una onza de protena magra equivale a: ? 1 onza de carne, pollo o pescado. ? 1huevo. ?  taza de tofu.  Coma algunos alimentos por da que contengan grasas saludables, como aguacates, frutos secos, semillas y pescado. Estilo de vida  Controle su nivel de glucemia con regularidad.  Haga actividad fsica habitualmente como se lo haya indicado el mdico. Esto puede incluir lo siguiente: ? 150minutos semanales de ejercicio de intensidad moderada o alta. Esto podra incluir caminatas dinmicas, ciclismo o gimnasia acutica. ? Realizar ejercicios de elongacin y de fortalecimiento, como yoga o levantamiento   de pesas, por lo menos 2veces por semana.  Tome los medicamentos como se lo haya indicado el mdico.  No consuma ningn producto que contenga nicotina o tabaco, como cigarrillos y cigarrillos electrnicos. Si necesita ayuda para dejar de fumar, consulte al mdico.  Trabaje con un asesor o instructor en diabetes para identificar estrategias para controlar el estrs y cualquier desafo emocional  y social. Preguntas para hacerle al mdico  Es necesario que consulte a un instructor en el cuidado de la diabetes?  Es necesario que me rena con un nutricionista?  A qu nmero puedo llamar si tengo preguntas?  Cules son los mejores momentos para controlar la glucemia? Dnde encontrar ms informacin:  Asociacin Estadounidense de la Diabetes (American Diabetes Association): diabetes.org  Academia de Nutricin y Diettica (Academy of Nutrition and Dietetics): www.eatright.org  Instituto Nacional de la Diabetes y las Enfermedades Digestivas y Renales (National Institute of Diabetes and Digestive and Kidney Diseases, NIH): www.niddk.nih.gov Resumen  Un plan de alimentacin saludable lo ayudar a controlar la glucemia y mantener un estilo de vida saludable.  Trabajar con un especialista en dietas y nutricin (nutricionista) puede ayudarlo a elaborar el mejor plan de alimentacin para usted.  Tenga en cuenta que los carbohidratos (hidratos de carbono) y el alcohol tienen efectos inmediatos en sus niveles de glucemia. Es importante contar los carbohidratos que ingiere y consumir alcohol con prudencia. Esta informacin no tiene como fin reemplazar el consejo del mdico. Asegrese de hacerle al mdico cualquier pregunta que tenga. Document Revised: 04/24/2017 Document Reviewed: 12/04/2016 Elsevier Patient Education  2020 Elsevier Inc.  

## 2020-08-09 DIAGNOSIS — I10 Essential (primary) hypertension: Secondary | ICD-10-CM | POA: Diagnosis not present

## 2020-08-09 DIAGNOSIS — E118 Type 2 diabetes mellitus with unspecified complications: Secondary | ICD-10-CM | POA: Diagnosis not present

## 2020-08-09 DIAGNOSIS — E7800 Pure hypercholesterolemia, unspecified: Secondary | ICD-10-CM | POA: Diagnosis not present

## 2020-08-25 DIAGNOSIS — H43811 Vitreous degeneration, right eye: Secondary | ICD-10-CM | POA: Diagnosis not present

## 2020-08-25 DIAGNOSIS — E119 Type 2 diabetes mellitus without complications: Secondary | ICD-10-CM | POA: Diagnosis not present

## 2020-08-25 DIAGNOSIS — H2513 Age-related nuclear cataract, bilateral: Secondary | ICD-10-CM | POA: Diagnosis not present

## 2020-08-25 DIAGNOSIS — H35033 Hypertensive retinopathy, bilateral: Secondary | ICD-10-CM | POA: Diagnosis not present

## 2020-08-25 LAB — HM DIABETES EYE EXAM

## 2020-08-26 ENCOUNTER — Ambulatory Visit: Payer: Medicare Other | Admitting: Emergency Medicine

## 2020-09-01 ENCOUNTER — Encounter: Payer: Self-pay | Admitting: *Deleted

## 2020-09-07 ENCOUNTER — Other Ambulatory Visit: Payer: Self-pay

## 2020-09-07 ENCOUNTER — Ambulatory Visit (INDEPENDENT_AMBULATORY_CARE_PROVIDER_SITE_OTHER): Payer: Medicare Other | Admitting: Emergency Medicine

## 2020-09-07 ENCOUNTER — Encounter: Payer: Self-pay | Admitting: Emergency Medicine

## 2020-09-07 VITALS — BP 136/69 | HR 86 | Temp 98.6°F | Resp 16 | Ht 63.0 in | Wt 189.0 lb

## 2020-09-07 DIAGNOSIS — E785 Hyperlipidemia, unspecified: Secondary | ICD-10-CM | POA: Diagnosis not present

## 2020-09-07 DIAGNOSIS — E1169 Type 2 diabetes mellitus with other specified complication: Secondary | ICD-10-CM | POA: Diagnosis not present

## 2020-09-07 DIAGNOSIS — R21 Rash and other nonspecific skin eruption: Secondary | ICD-10-CM | POA: Diagnosis not present

## 2020-09-07 DIAGNOSIS — I152 Hypertension secondary to endocrine disorders: Secondary | ICD-10-CM

## 2020-09-07 DIAGNOSIS — E119 Type 2 diabetes mellitus without complications: Secondary | ICD-10-CM

## 2020-09-07 DIAGNOSIS — E1159 Type 2 diabetes mellitus with other circulatory complications: Secondary | ICD-10-CM

## 2020-09-07 LAB — POCT GLYCOSYLATED HEMOGLOBIN (HGB A1C): Hemoglobin A1C: 7.6 % — AB (ref 4.0–5.6)

## 2020-09-07 LAB — GLUCOSE, POCT (MANUAL RESULT ENTRY): POC Glucose: 223 mg/dl — AB (ref 70–99)

## 2020-09-07 MED ORDER — DAPAGLIFLOZIN PROPANEDIOL 5 MG PO TABS: 5.0000 mg | ORAL_TABLET | Freq: Every day | ORAL | 3 refills | Status: DC

## 2020-09-07 MED ORDER — ATORVASTATIN CALCIUM 40 MG PO TABS: 40.0000 mg | ORAL_TABLET | Freq: Every day | ORAL | 3 refills | Status: DC

## 2020-09-07 MED ORDER — TRIAMCINOLONE ACETONIDE 0.1 % EX CREA
1.0000 | TOPICAL_CREAM | Freq: Two times a day (BID) | CUTANEOUS | 0 refills | Status: AC
Start: 2020-09-07 — End: ?

## 2020-09-07 NOTE — Assessment & Plan Note (Signed)
Well-controlled hypertension.  Continue present medications.  No changes. Improved diabetic control with hemoglobin A1c at 7.6.  Continue metformin and Farxiga. Diet and nutrition discussed. Follow-up in 3 months.

## 2020-09-07 NOTE — Patient Instructions (Addendum)
   If you have lab work done today you will be contacted with your lab results within the next 2 weeks.  If you have not heard from us then please contact us. The fastest way to get your results is to register for My Chart.   IF you received an x-ray today, you will receive an invoice from [facility]. Please contact [facility] at [phone] with questions or concerns regarding your invoice.   IF you received labwork today, you will receive an invoice from [facility]. Please contact [facility] at [phone] with questions or concerns regarding your invoice.   Our billing staff will not be able to assist you with questions regarding bills from these companies.  You will be contacted with the lab results as soon as they are available. The fastest way to get your results is to activate your My Chart account. Instructions are located on the last page of this paperwork. If you have not heard from us regarding the results in 2 weeks, please contact this office.     Diabetes Mellitus and Nutrition, Adult When you have diabetes, or diabetes mellitus, it is very important to have healthy eating habits because your blood sugar (glucose) levels are greatly affected by what you eat and drink. Eating healthy foods in the right amounts, at about the same times every day, can help you:  Control your blood glucose.  Lower your risk of heart disease.  Improve your blood pressure.  Reach or maintain a healthy weight. What can affect my meal plan? Every person with diabetes is different, and each person has different needs for a meal plan. Your health care provider may recommend that you work with a dietitian to make a meal plan that is best for you. Your meal plan may vary depending on factors such as:  The calories you need.  The medicines you take.  Your weight.  Your blood glucose, blood pressure, and cholesterol levels.  Your activity level.  Other health conditions you  have, such as heart or kidney disease. How do carbohydrates affect me? Carbohydrates, also called carbs, affect your blood glucose level more than any other type of food. Eating carbs naturally raises the amount of glucose in your blood. Carb counting is a method for keeping track of how many carbs you eat. Counting carbs is important to keep your blood glucose at a healthy level, especially if you use insulin or take certain oral diabetes medicines. It is important to know how many carbs you can safely have in each meal. This is different for every person. Your dietitian can help you calculate how many carbs you should have at each meal and for each snack. How does alcohol affect me? Alcohol can cause a sudden decrease in blood glucose (hypoglycemia), especially if you use insulin or take certain oral diabetes medicines. Hypoglycemia can be a life-threatening condition. Symptoms of hypoglycemia, such as sleepiness, dizziness, and confusion, are similar to symptoms of having too much alcohol.  Do not drink alcohol if: ? Your health care provider tells you not to drink. ? You are pregnant, may be pregnant, or are planning to become pregnant.  If you drink alcohol: ? Do not drink on an empty stomach. ? Limit how much you use to:  0-1 drink a day for women.  0-2 drinks a day for men. ? Be aware of how much alcohol is in your drink. In the [place] one drink equals one 12 oz bottle of beer (355 mL),   one 5 oz glass of wine (148 mL), or one 1 oz glass of hard liquor (44 mL). ? Keep yourself hydrated with water, diet soda, or unsweetened iced tea.  Keep in mind that regular soda, juice, and other mixers may contain a lot of sugar and must be counted as carbs. What are tips for following this plan? Reading food labels  Start by checking the serving size on the "Nutrition Facts" label of packaged foods and drinks. The amount of calories, carbs, fats, and other nutrients listed on the label is based on  one serving of the item. Many items contain more than one serving per package.  Check the total grams (g) of carbs in one serving. You can calculate the number of servings of carbs in one serving by dividing the total carbs by 15. For example, if a food has 30 g of total carbs per serving, it would be equal to 2 servings of carbs.  Check the number of grams (g) of saturated fats and trans fats in one serving. Choose foods that have a low amount or none of these fats.  Check the number of milligrams (mg) of salt (sodium) in one serving. Most people should limit total sodium intake to less than 2,300 mg per day.  Always check the nutrition information of foods labeled as "low-fat" or "nonfat." These foods may be higher in added sugar or refined carbs and should be avoided.  Talk to your dietitian to identify your daily goals for nutrients listed on the label. Shopping  Avoid buying canned, pre-made, or processed foods. These foods tend to be high in fat, sodium, and added sugar.  Shop around the outside edge of the grocery store. This is where you will most often find fresh fruits and vegetables, bulk grains, fresh meats, and fresh dairy. Cooking  Use low-heat cooking methods, such as baking, instead of high-heat cooking methods like deep frying.  Cook using healthy oils, such as olive, canola, or sunflower oil.  Avoid cooking with butter, cream, or high-fat meats. Meal planning  Eat meals and snacks regularly, preferably at the same times every day. Avoid going long periods of time without eating.  Eat foods that are high in fiber, such as fresh fruits, vegetables, beans, and whole grains. Talk with your dietitian about how many servings of carbs you can eat at each meal.  Eat 4-6 oz (112-168 g) of lean protein each day, such as lean meat, chicken, fish, eggs, or tofu. One ounce (oz) of lean protein is equal to: ? 1 oz (28 g) of meat, chicken, or fish. ? 1 egg. ?  cup (62 g) of  tofu.  Eat some foods each day that contain healthy fats, such as avocado, nuts, seeds, and fish.   What foods should I eat? Fruits Berries. Apples. Oranges. Peaches. Apricots. Plums. Grapes. Mango. Papaya. Pomegranate. Kiwi. Cherries. Vegetables Lettuce. Spinach. Leafy greens, including kale, chard, collard greens, and mustard greens. Beets. Cauliflower. Cabbage. Broccoli. Carrots. Green beans. Tomatoes. Peppers. Onions. Cucumbers. Brussels sprouts. Grains Whole grains, such as whole-wheat or whole-grain bread, crackers, tortillas, cereal, and pasta. Unsweetened oatmeal. Quinoa. Brown or wild rice. Meats and other proteins Seafood. Poultry without skin. Lean cuts of poultry and beef. Tofu. Nuts. Seeds. Dairy Low-fat or fat-free dairy products such as milk, yogurt, and cheese. The items listed above may not be a complete list of foods and beverages you can eat. Contact a dietitian for more information. What foods should I avoid? Fruits Fruits canned   with syrup. Vegetables Canned vegetables. Frozen vegetables with butter or cream sauce. Grains Refined white flour and flour products such as bread, pasta, snack foods, and cereals. Avoid all processed foods. Meats and other proteins Fatty cuts of meat. Poultry with skin. Breaded or fried meats. Processed meat. Avoid saturated fats. Dairy Full-fat yogurt, cheese, or milk. Beverages Sweetened drinks, such as soda or iced tea. The items listed above may not be a complete list of foods and beverages you should avoid. Contact a dietitian for more information. Questions to ask a health care provider  Do I need to meet with a diabetes educator?  Do I need to meet with a dietitian?  What number can I call if I have questions?  When are the best times to check my blood glucose? Where to find more information:  American Diabetes Association: diabetes.org  Academy of Nutrition and Dietetics: www.eatright.org  National Institute of  Diabetes and Digestive and Kidney Diseases: www.niddk.nih.gov  Association of Diabetes Care and Education Specialists: www.diabeteseducator.org Summary  It is important to have healthy eating habits because your blood sugar (glucose) levels are greatly affected by what you eat and drink.  A healthy meal plan will help you control your blood glucose and maintain a healthy lifestyle.  Your health care provider may recommend that you work with a dietitian to make a meal plan that is best for you.  Keep in mind that carbohydrates (carbs) and alcohol have immediate effects on your blood glucose levels. It is important to count carbs and to use alcohol carefully. This information is not intended to replace advice given to you by your health care provider. Make sure you discuss any questions you have with your health care provider. Document Revised: 07/22/2019 Document Reviewed: 07/22/2019 Elsevier Patient Education  2021 Elsevier Inc.  

## 2020-09-07 NOTE — Progress Notes (Signed)
Patrick Park 70 y.o.   Chief Complaint  Patient presents with  . Diabetes    Per patient wants sugar checked and this morning before eating 110, this afternoon 176 after eating  . Medication Refill    Atorvastatin, Farxiga and Triamcinolone cream    HISTORY OF PRESENT ILLNESS: This is a 70 y.o. male with history of diabetes, dyslipidemia, hypertension here for follow-up of medication refill. Doing well.  Has no complaints or medical concerns today. Fully vaccinated against COVID.  HPI   Prior to Admission medications   Medication Sig Start Date End Date Taking? Authorizing Provider  aspirin EC 81 MG tablet Take 81 mg by mouth daily.   Yes [provider]  Dulaglutide (TRULICITY) 0.75 MG/0.5ML SOPN Inject 0.75 mg into the skin once a week. 07/27/20  Yes Tharun Cappella, Eilleen KempfMiguel Jose, MD  Fish Oil OIL Take 1 capsule by mouth daily.   Yes [provider]  losartan-hydrochlorothiazide (HYZAAR) 50-12.5 MG tablet Take 1 tablet by mouth daily. 07/27/20  Yes Georgina QuintSagardia, Damon Baisch Jose, MD  metFORMIN (GLUCOPHAGE) 1000 MG tablet Take 1 tablet (1,000 mg total) by mouth 2 (two) times daily with a meal. 07/27/20  Yes Laquitha Heslin, Eilleen KempfMiguel Jose, MD  montelukast (SINGULAIR) 10 MG tablet Take 1 tablet (10 mg total) by mouth at bedtime. 04/14/19  Yes Fulp, Cammie, MD  Multiple Vitamin (MULTIVITAMIN) tablet Take 1 tablet by mouth daily.   Yes [provider]  omeprazole (PRILOSEC) 20 MG capsule Take 1 capsule (20 mg total) by mouth daily. 04/14/19  Yes Fulp, Cammie, MD  atorvastatin (LIPITOR) 40 MG tablet Take 1 tablet (40 mg total) by mouth daily. 09/07/20   Georgina QuintSagardia, Lanny Lipkin Jose, MD  dapagliflozin propanediol (FARXIGA) 5 MG TABS tablet Take 1 tablet (5 mg total) by mouth daily before breakfast. 09/07/20   Georgina QuintSagardia, Ronney Honeywell Jose, MD  fluticasone [facility](FLONASE) 50 MCG/ACT nasal spray Place 2 sprays into both nostrils daily. Patient not taking: Reported on 09/07/2020 09/23/19   Marcine MatarJohnson, Deborah B, MD   glucose blood test strip Use to check FSBS daily. Dx: E11.9 09/23/19   Marcine MatarJohnson, Deborah B, MD  Lancets MISC OneTouch Delica Lancets 33 gauge  check glucose levels 2-3 times a day DX: E11.65, NPI [id number], Date: 03/21/17 09/23/19   Marcine MatarJohnson, Deborah B, MD  triamcinolone (KENALOG) 0.1 % Apply 1 application topically 2 (two) times daily. 09/07/20   Georgina QuintSagardia, Caleyah Jr Jose, MD    No Known Allergies  Patient Active Problem List   Diagnosis Date Noted  . Class 1 obesity due to excess calories with serious comorbidity and body mass index (BMI) of 32.0 to 32.9 in adult 09/23/2019  . Allergic rhinitis 03/28/2019  . Hypertension associated with diabetes (HCC) 10/03/2018  . Dyslipidemia associated with type 2 diabetes mellitus (HCC) 10/03/2018  . Dyslipidemia 10/03/2018  . General weakness 10/03/2018  . ED (erectile dysfunction) 01/22/2013  . GERD (gastroesophageal reflux disease) 10/24/2012    Past Medical History:  Diagnosis Date  . Allergy   . Benign paroxysmal positional vertigo   . Diabetes mellitus without complication (HCC)   . GERD (gastroesophageal reflux disease)   . Hyperlipidemia   . Hypertension     Past Surgical History:  Procedure Laterality Date  . COLONOSCOPY     11-12 yrs ago- normal per pt-pt doesnt remeber where he had the colonoscopy   . HERNIA REPAIR      Social History   Socioeconomic History  . Marital status: Married    Spouse name: Not on file  .  Number of children: Not on file  . Years of education: Not on file  . Highest education level: Not on file  Occupational History  . Not on file  Tobacco Use  . Smoking status: Current Some Day Smoker  . Smokeless tobacco: Never Used  . Tobacco comment: 0-2/day  Vaping Use  . Vaping Use: Never used  Substance and Sexual Activity  . Alcohol use: No  . Drug use: No  . Sexual activity: Not on file  Other Topics Concern  . Not on file  Social History Narrative  . Not on file   Social Determinants of Health    Financial Resource Strain: Not on file  Food Insecurity: Not on file  Transportation Needs: Not on file  Physical Activity: Not on file  Stress: Not on file  Social Connections: Not on file  Intimate Partner Violence: Not on file    Family History  Problem Relation Age of Onset  . Colon cancer Neg Hx   . Colon polyps Neg Hx   . Esophageal cancer Neg Hx   . Rectal cancer Neg Hx   . Stomach cancer Neg Hx      Review of Systems  Constitutional: Negative.  Negative for chills and fever.  HENT: Negative.  Negative for congestion and sore throat.   Respiratory: Negative.  Negative for cough and shortness of breath.   Cardiovascular: Positive for leg swelling (Intermittent bilateral swelling).  Gastrointestinal: Negative.  Negative for abdominal pain, blood in stool, diarrhea, melena, nausea and vomiting.  Genitourinary: Negative.  Negative for dysuria and hematuria.  Musculoskeletal: Positive for joint pain (Occasional left knee pain).  Skin:       Intermittent diffuse itchy rash  Neurological: Negative.  Negative for dizziness and headaches.  All other systems reviewed and are negative.   Today's Vitals   09/07/20 1552  BP: 136/69  Pulse: 86  Resp: 16  Temp: 98.6 F (37 C)  TempSrc: Temporal  SpO2: 98%  Weight: 189 lb (85.7 kg)  Height: 5\' 3"  (1.6 m)   Body mass index is 33.48 kg/m.  Physical Exam Vitals reviewed.  Constitutional:      Appearance: Normal appearance.  HENT:     Head: Normocephalic.  Eyes:     Extraocular Movements: Extraocular movements intact.     Conjunctiva/sclera: Conjunctivae normal.     Pupils: Pupils are equal, round, and reactive to light.  Cardiovascular:     Rate and Rhythm: Normal rate and regular rhythm.     Pulses: Normal pulses.     Heart sounds: Normal heart sounds.  Pulmonary:     Effort: Pulmonary effort is normal.     Breath sounds: Normal breath sounds.  Abdominal:     General: There is no distension.     Palpations:  Abdomen is soft.     Tenderness: There is no abdominal tenderness.  Musculoskeletal:     Cervical back: Normal range of motion and neck supple.     Right lower leg: No edema.     Left lower leg: No edema.  Skin:    General: Skin is warm and dry.     Capillary Refill: Capillary refill takes less than 2 seconds.  Neurological:     General: No focal deficit present.     Mental Status: He is alert and oriented to person, place, and time.  Psychiatric:        Mood and Affect: Mood normal.        Behavior: Behavior  normal.     Results for orders placed or performed in visit on 09/07/20 (from the past 24 hour(s))  POCT glucose (manual entry)     Status: Abnormal   Collection Time: 09/07/20  4:08 PM  Result Value Ref Range   POC Glucose 223 (A) 70 - 99 mg/dl  POCT glycosylated hemoglobin (Hb A1C)     Status: Abnormal   Collection Time: 09/07/20  4:14 PM  Result Value Ref Range   Hemoglobin A1C 7.6 (A) 4.0 - 5.6 %   HbA1c POC (<> result, manual entry)     HbA1c, POC (prediabetic range)     HbA1c, POC (controlled diabetic range)      ASSESSMENT & PLAN: Hypertension associated with diabetes (HCC) Well-controlled hypertension.  Continue present medications.  No changes. Improved diabetic control with hemoglobin A1c at 7.6.  Continue metformin and Farxiga. Diet and nutrition discussed. Follow-up in 3 months.  Patrick Park was seen today for diabetes and medication refill.  Diagnoses and all orders for this visit:  Hypertension associated with diabetes (HCC)  Dyslipidemia associated with type 2 diabetes mellitus (HCC) -     POCT glucose (manual entry) -     POCT glycosylated hemoglobin (Hb A1C) -     atorvastatin (LIPITOR) 40 MG tablet; Take 1 tablet (40 mg total) by mouth daily.  Type 2 diabetes mellitus without complication, without long-term current use of insulin (HCC) -     dapagliflozin propanediol (FARXIGA) 5 MG TABS tablet; Take 1 tablet (5 mg total) by mouth daily before  breakfast.  Rash and nonspecific skin eruption -     triamcinolone (KENALOG) 0.1 %; Apply 1 application topically 2 (two) times daily.    Patient Instructions       If you have lab work done today you will be contacted with your lab results within the next 2 weeks.  If you have not heard from [place] then please contact us. The fastest way to get your results is to register for My Chart.   IF you received an x-ray today, you will receive an invoice from [facility]. Please contact [facility] at [phone] with questions or concerns regarding your invoice.   IF you received labwork today, you will receive an invoice from [place]. Please contact [facility] at [phone] with questions or concerns regarding your invoice.   Our billing staff will not be able to assist you with questions regarding bills from these companies.  You will be contacted with the lab results as soon as they are available. The fastest way to get your results is to activate your My Chart account. Instructions are located on the last page of this paperwork. If you have not heard from [place] regarding the results in 2 weeks, please contact this office.     Diabetes Mellitus and Nutrition, Adult When you have diabetes, or diabetes mellitus, it is very important to have healthy eating habits because your blood sugar (glucose) levels are greatly affected by what you eat and drink. Eating healthy foods in the right amounts, at about the same times every day, can help you:  Control your blood glucose.  Lower your risk of heart disease.  Improve your blood pressure.  Reach or maintain a healthy weight. What can affect my meal plan? Every person with diabetes is different, and each person has different needs for a meal plan. Your health care provider may recommend that you work with a dietitian to make a meal plan that is best for you.  Your meal plan may vary depending on factors such as:  The calories  you need.  The medicines you take.  Your weight.  Your blood glucose, blood pressure, and cholesterol levels.  Your activity level.  Other health conditions you have, such as heart or kidney disease. How do carbohydrates affect me? Carbohydrates, also called carbs, affect your blood glucose level more than any other type of food. Eating carbs naturally raises the amount of glucose in your blood. Carb counting is a method for keeping track of how many carbs you eat. Counting carbs is important to keep your blood glucose at a healthy level, especially if you use insulin or take certain oral diabetes medicines. It is important to know how many carbs you can safely have in each meal. This is different for every person. Your dietitian can help you calculate how many carbs you should have at each meal and for each snack. How does alcohol affect me? Alcohol can cause a sudden decrease in blood glucose (hypoglycemia), especially if you use insulin or take certain oral diabetes medicines. Hypoglycemia can be a life-threatening condition. Symptoms of hypoglycemia, such as sleepiness, dizziness, and confusion, are similar to symptoms of having too much alcohol.  Do not drink alcohol if: ? Your health care provider tells you not to drink. ? You are pregnant, may be pregnant, or are planning to become pregnant.  If you drink alcohol: ? Do not drink on an empty stomach. ? Limit how much you use to:  0-1 drink a day for women.  0-2 drinks a day for men. ? Be aware of how much alcohol is in your drink. In the [place] one drink equals one 12 oz bottle of beer (355 mL), one 5 oz glass of wine (148 mL), or one 1 oz glass of hard liquor (44 mL). ? Keep yourself hydrated with water, diet soda, or unsweetened iced tea.  Keep in mind that regular soda, juice, and other mixers may contain a lot of sugar and must be counted as carbs. What are tips for following this plan? Reading food labels  Start by  checking the serving size on the "Nutrition Facts" label of packaged foods and drinks. The amount of calories, carbs, fats, and other nutrients listed on the label is based on one serving of the item. Many items contain more than one serving per package.  Check the total grams (g) of carbs in one serving. You can calculate the number of servings of carbs in one serving by dividing the total carbs by 15. For example, if a food has 30 g of total carbs per serving, it would be equal to 2 servings of carbs.  Check the number of grams (g) of saturated fats and trans fats in one serving. Choose foods that have a low amount or none of these fats.  Check the number of milligrams (mg) of salt (sodium) in one serving. Most people should limit total sodium intake to less than 2,300 mg per day.  Always check the nutrition information of foods labeled as "low-fat" or "nonfat." These foods may be higher in added sugar or refined carbs and should be avoided.  Talk to your dietitian to identify your daily goals for nutrients listed on the label. Shopping  Avoid buying canned, pre-made, or processed foods. These foods tend to be high in fat, sodium, and added sugar.  Shop around the outside edge of the grocery store. This is where you will most often find fresh fruits  and vegetables, bulk grains, fresh meats, and fresh dairy. Cooking  Use low-heat cooking methods, such as baking, instead of high-heat cooking methods like deep frying.  Cook using healthy oils, such as olive, canola, or sunflower oil.  Avoid cooking with butter, cream, or high-fat meats. Meal planning  Eat meals and snacks regularly, preferably at the same times every day. Avoid going long periods of time without eating.  Eat foods that are high in fiber, such as fresh fruits, vegetables, beans, and whole grains. Talk with your dietitian about how many servings of carbs you can eat at each meal.  Eat 4-6 oz (112-168 g) of lean protein each  day, such as lean meat, chicken, fish, eggs, or tofu. One ounce (oz) of lean protein is equal to: ? 1 oz (28 g) of meat, chicken, or fish. ? 1 egg. ?  cup (62 g) of tofu.  Eat some foods each day that contain healthy fats, such as avocado, nuts, seeds, and fish.   What foods should I eat? Fruits Berries. Apples. Oranges. Peaches. Apricots. Plums. Grapes. Mango. Papaya. Pomegranate. Kiwi. Cherries. Vegetables Lettuce. Spinach. Leafy greens, including kale, chard, collard greens, and mustard greens. Beets. Cauliflower. Cabbage. Broccoli. Carrots. Green beans. Tomatoes. Peppers. Onions. Cucumbers. Brussels sprouts. Grains Whole grains, such as whole-wheat or whole-grain bread, crackers, tortillas, cereal, and pasta. Unsweetened oatmeal. Quinoa. Brown or wild rice. Meats and other proteins Seafood. Poultry without skin. Lean cuts of poultry and beef. Tofu. Nuts. Seeds. Dairy Low-fat or fat-free dairy products such as milk, yogurt, and cheese. The items listed above may not be a complete list of foods and beverages you can eat. Contact a dietitian for more information. What foods should I avoid? Fruits Fruits canned with syrup. Vegetables Canned vegetables. Frozen vegetables with butter or cream sauce. Grains Refined white flour and flour products such as bread, pasta, snack foods, and cereals. Avoid all processed foods. Meats and other proteins Fatty cuts of meat. Poultry with skin. Breaded or fried meats. Processed meat. Avoid saturated fats. Dairy Full-fat yogurt, cheese, or milk. Beverages Sweetened drinks, such as soda or iced tea. The items listed above may not be a complete list of foods and beverages you should avoid. Contact a dietitian for more information. Questions to ask a health care provider  Do I need to meet with a diabetes educator?  Do I need to meet with a dietitian?  What number can I call if I have questions?  When are the best times to check my blood  glucose? Where to find more information:  American Diabetes Association: diabetes.org  Academy of Nutrition and Dietetics: www.eatright.[employer] of Diabetes and Digestive and Kidney Diseases: CarFlippers.tn  Association of Diabetes Care and Education Specialists: www.diabeteseducator.org Summary  It is important to have healthy eating habits because your blood sugar (glucose) levels are greatly affected by what you eat and drink.  A healthy meal plan will help you control your blood glucose and maintain a healthy lifestyle.  Your health care provider may recommend that you work with a dietitian to make a meal plan that is best for you.  Keep in mind that carbohydrates (carbs) and alcohol have immediate effects on your blood glucose levels. It is important to count carbs and to use alcohol carefully. This information is not intended to replace advice given to you by your health care provider. Make sure you discuss any questions you have with your health care provider. Document Revised: 07/22/2019 Document Reviewed: 07/22/2019 Elsevier  Patient Education  2021 [employer].      Agustina Caroli, MD [facility]

## 2020-10-26 ENCOUNTER — Ambulatory Visit (INDEPENDENT_AMBULATORY_CARE_PROVIDER_SITE_OTHER): Payer: Medicare Other | Admitting: Emergency Medicine

## 2020-10-26 ENCOUNTER — Encounter: Payer: Self-pay | Admitting: Emergency Medicine

## 2020-10-26 ENCOUNTER — Other Ambulatory Visit: Payer: Self-pay

## 2020-10-26 VITALS — BP 130/60 | HR 72 | Temp 98.4°F | Resp 16 | Ht 63.0 in | Wt 181.0 lb

## 2020-10-26 DIAGNOSIS — I10 Essential (primary) hypertension: Secondary | ICD-10-CM

## 2020-10-26 DIAGNOSIS — E1159 Type 2 diabetes mellitus with other circulatory complications: Secondary | ICD-10-CM | POA: Diagnosis not present

## 2020-10-26 DIAGNOSIS — E1165 Type 2 diabetes mellitus with hyperglycemia: Secondary | ICD-10-CM

## 2020-10-26 DIAGNOSIS — E1169 Type 2 diabetes mellitus with other specified complication: Secondary | ICD-10-CM

## 2020-10-26 DIAGNOSIS — J302 Other seasonal allergic rhinitis: Secondary | ICD-10-CM

## 2020-10-26 DIAGNOSIS — I152 Hypertension secondary to endocrine disorders: Secondary | ICD-10-CM

## 2020-10-26 DIAGNOSIS — E785 Hyperlipidemia, unspecified: Secondary | ICD-10-CM

## 2020-10-26 LAB — POCT GLYCOSYLATED HEMOGLOBIN (HGB A1C): Hemoglobin A1C: 7.8 % — AB (ref 4.0–5.6)

## 2020-10-26 LAB — GLUCOSE, POCT (MANUAL RESULT ENTRY): POC Glucose: 207 mg/dl — AB (ref 70–99)

## 2020-10-26 MED ORDER — LANCETS MISC: 6 refills | Status: AC

## 2020-10-26 MED ORDER — DAPAGLIFLOZIN PROPANEDIOL 10 MG PO TABS
10.0000 mg | ORAL_TABLET | Freq: Every day | ORAL | 3 refills | Status: AC
Start: 2020-10-26 — End: 2021-01-24

## 2020-10-26 MED ORDER — LOSARTAN POTASSIUM-HCTZ 50-12.5 MG PO TABS: 1.0000 | ORAL_TABLET | Freq: Every day | ORAL | 3 refills | Status: DC

## 2020-10-26 MED ORDER — ATORVASTATIN CALCIUM 40 MG PO TABS: 40.0000 mg | ORAL_TABLET | Freq: Every day | ORAL | 3 refills | Status: DC

## 2020-10-26 MED ORDER — AZELASTINE-FLUTICASONE 137-50 MCG/ACT NA SUSP: NASAL | 5 refills | Status: AC

## 2020-10-26 MED ORDER — LANCETS MISC: 6 refills | Status: DC

## 2020-10-26 MED ORDER — GLUCOSE BLOOD VI STRP: ORAL_STRIP | 6 refills | Status: AC

## 2020-10-26 NOTE — Patient Instructions (Addendum)
   If you have lab work done today you will be contacted with your lab results within the next 2 weeks.  If you have not heard from us then please contact us. The fastest way to get your results is to register for My Chart.   IF you received an x-ray today, you will receive an invoice from [facility]. Please contact [facility] at [phone] with questions or concerns regarding your invoice.   IF you received labwork today, you will receive an invoice from [facility]. Please contact [facility] at [phone] with questions or concerns regarding your invoice.   Our billing staff will not be able to assist you with questions regarding bills from these companies.  You will be contacted with the lab results as soon as they are available. The fastest way to get your results is to activate your My Chart account. Instructions are located on the last page of this paperwork. If you have not heard from us regarding the results in 2 weeks, please contact this office.     Diabetes mellitus y nutricin, en adultos Diabetes Mellitus and Nutrition, Adult Si sufre de diabetes, o diabetes mellitus, es muy importante tener hbitos alimenticios saludables debido a que sus niveles de azcar en la sangre (glucosa) se ven afectados en gran medida por lo que come y bebe. [place] alimentos saludables en las cantidades correctas, aproximadamente a la misma hora todos los das, lo ayudar a:  Controlar la glucemia.  Disminuir el riesgo de sufrir una enfermedad cardaca.  Mejorar la presin arterial.  Alcanzar o mantener un peso saludable. Qu puede afectar mi plan de alimentacin? Todas las personas que sufren de diabetes son diferentes y cada una tiene necesidades diferentes en cuanto a un plan de alimentacin. El mdico puede recomendarle que trabaje con un nutricionista para elaborar el mejor plan para usted. Su plan de alimentacin puede variar segn factores como:  Las caloras que  necesita.  Los medicamentos que toma.  Su peso.  Sus niveles de glucemia, presin arterial y colesterol.  Su nivel de actividad.  Otras afecciones que tenga, como enfermedades cardacas o renales. Cmo me afectan los carbohidratos? Los carbohidratos, o hidratos de carbono, afectan su nivel de glucemia ms que cualquier otro tipo de alimento. La ingesta de carbohidratos naturalmente aumenta la cantidad de glucosa en la sangre. El recuento de carbohidratos es un mtodo destinado a llevar un registro de la cantidad de carbohidratos que se consumen. El recuento de carbohidratos es importante para mantener la glucemia a un nivel saludable, especialmente si utiliza insulina o toma determinados medicamentos por va oral para la diabetes. Es importante conocer la cantidad de carbohidratos que se pueden ingerir en cada comida sin correr ningn riesgo. Esto es diferente en cada persona. Su nutricionista puede ayudarlo a calcular la cantidad de carbohidratos que debe ingerir en cada comida y en cada refrigerio. Cmo me afecta el alcohol? El alcohol puede provocar disminuciones sbitas de la glucemia (hipoglucemia), especialmente si utiliza insulina o toma determinados medicamentos por va oral para la diabetes. La hipoglucemia es una afeccin potencialmente mortal. Los sntomas de la hipoglucemia, como somnolencia, mareos y confusin, son similares a los sntomas de haber consumido demasiado alcohol.  No beba alcohol si: ? Su mdico le indica no hacerlo. ? Est embarazada, puede estar embarazada o est tratando de quedar embarazada.  Si bebe alcohol: ? No beba con el estmago vaco. ? Limite la cantidad que bebe:  De 0 a 1 medida por da para las   mujeres.  De 0 a 2 medidas por da para los hombres. ? Est atento a la cantidad de alcohol que hay en las bebidas que toma. En [place], una medida equivale a una botella de cerveza de 12oz (355ml), un vaso de vino de 5oz (148ml) o un vaso  de una bebida alcohlica de alta graduacin de 1oz (44ml). ? Mantngase hidratado bebiendo agua, refrescos dietticos o t helado sin azcar.  Tenga en cuenta que los refrescos comunes, los jugos y otras bebida para mezclar pueden contener mucha azcar y se deben contar como carbohidratos. Consejos para seguir este plan Leer las etiquetas de los alimentos  Comience por leer el tamao de la porcin en la "Informacin nutricional" en las etiquetas de los alimentos envasados y las bebidas. La cantidad de caloras, carbohidratos, grasas y otros nutrientes mencionados en la etiqueta se basan en una porcin del alimento. Muchos alimentos contienen ms de una porcin por envase.  Verifique la cantidad total de gramos (g) de carbohidratos totales en una porcin. Puede calcular la cantidad de porciones de carbohidratos al dividir el total de carbohidratos por 15. Por ejemplo, si un alimento tiene un total de 30g de carbohidratos totales por porcin, equivale a 2 porciones de carbohidratos.  Verifique la cantidad de gramos (g) de grasas saturadas y grasas trans de una porcin. Escoja alimentos que no contengan estas grasas o que su contenido de estas sea bajo.  Verifique la cantidad de miligramos (mg) de sal (sodio) en una porcin. La mayora de las personas deben limitar la ingesta de sodio total a menos de 2300mg por da.  Siempre consulte la informacin nutricional de los alimentos etiquetados como "con bajo contenido de grasa" o "sin grasa". Estos alimentos pueden tener un mayor contenido de azcar agregada o carbohidratos refinados, y deben evitarse.  Hable con su nutricionista para identificar sus objetivos diarios en cuanto a los nutrientes mencionados en la etiqueta. Al ir de compras  Evite comprar alimentos procesados, enlatados o precocidos. Estos alimentos tienden a tener una mayor cantidad de grasa, sodio y azcar agregada.  Compre en la zona exterior de la tienda de comestibles. Esta es  la zona donde se encuentran con mayor frecuencia las frutas y las verduras frescas, los cereales a granel, las carnes frescas y los productos lcteos frescos. Al cocinar  Utilice mtodos de coccin a baja temperatura, como hornear, en lugar de mtodos de coccin a alta temperatura, como frer en abundante aceite.  Cocine con aceites saludables, como el aceite de oliva, canola o girasol.  Evite cocinar con manteca, crema o carnes con alto contenido de grasa. Planificacin de las comidas  Coma las comidas y los refrigerios regularmente, preferentemente a la misma hora todos los das. Evite pasar largos perodos de tiempo sin comer.  Consuma alimentos ricos en fibra, como frutas frescas, verduras, frijoles y cereales integrales. Consulte a su nutricionista sobre cuntas porciones de carbohidratos puede consumir en cada comida.  Consuma entre 4 y 6 onzas (entre 112 y 168g) de protenas magras por da, como carnes magras, pollo, pescado, huevos o tofu. Una onza (oz) de protena magra equivale a: ? 1 onza (28g) de carne, pollo o pescado. ? 1huevo. ?  de taza (62 g) de tofu.  Coma algunos alimentos por da que contengan grasas saludables, como aguacates, frutos secos, semillas y pescado.   Qu alimentos debo comer? Frutas Bayas. Manzanas. Naranjas. Duraznos. Damascos. Ciruelas. Uvas. Mango. Papaya. Granada. Kiwi. Cerezas. Verduras Lechuga. Espinaca. Verduras de hoja verde, que incluyen   col rizada, acelga, hojas de berza y de mostaza. Remolachas. Coliflor. Repollo. Brcoli. Zanahorias. Judas verdes. Tomates. Pimientos. Cebollas. Pepinos. Coles de Bruselas. Granos Granos integrales, como panes, galletas, tortillas, cereales y pastas de salvado o integrales. Avena sin azcar. Quinua. Arroz integral o salvaje. Carnes y otras protenas Mariscos. Carne de ave sin piel. Cortes magros de ave y carne de res. Tofu. Frutos secos. Semillas. Lcteos Productos lcteos sin grasa o con bajo contenido de  grasa, como leche, yogur y queso. Es posible que los productos que se enumeran ms arriba no constituyan una lista completa de los alimentos y las bebidas que puede tomar. Consulte a un nutricionista para obtener ms informacin. Qu alimentos debo evitar? Frutas Frutas enlatadas al almbar. Verduras Verduras enlatadas. Verduras congeladas con mantequilla o salsa de crema. Granos Productos elaborados con harina y harina blanca refinada, como panes, pastas, bocadillos y cereales. Evite todos los alimentos procesados. Carnes y otras protenas Cortes de carne con alto contenido de grasa. Carne de ave con piel. Carnes empanizadas o fritas. Carne procesada. Evite las grasas saturadas. Lcteos Yogur, queso o leche enteros. Bebidas Bebidas azucaradas, como gaseosas o t helado. Es posible que los productos que se enumeran ms arriba no constituyan una lista completa de los alimentos y las bebidas que debe evitar. Consulte a un nutricionista para obtener ms informacin. Preguntas para hacerle al mdico  Es necesario que me rena con un instructor en el cuidado de la diabetes?  Es necesario que me rena con un nutricionista?  A qu nmero puedo llamar si tengo preguntas?  Cules son los mejores momentos para controlar la glucemia? Dnde encontrar ms informacin:  Asociacin Estadounidense de la Diabetes (American Diabetes Association): diabetes.org  Academy of Nutrition and Dietetics (Academia de Nutricin y Diettica): www.eatright.org  National Institute of Diabetes and Digestive and Kidney Diseases (Instituto Nacional de la Diabetes y las Enfermedades Digestivas y Renales): www.niddk.nih.gov  Association of Diabetes Care and Education Specialists (Asociacin de Especialistas en Atencin y Educacin sobre la Diabetes): www.diabeteseducator.org Resumen  Es importante tener hbitos alimenticios saludables debido a que sus niveles de azcar en la sangre (glucosa) se ven afectados en  gran medida por lo que come y bebe.  Un plan de alimentacin saludable lo ayudar a controlar la glucemia y mantener un estilo de vida saludable.  El mdico puede recomendarle que trabaje con un nutricionista para elaborar el mejor plan para usted.  Tenga en cuenta que los carbohidratos (hidratos de carbono) y el alcohol tienen efectos inmediatos en sus niveles de glucemia. Es importante contar los carbohidratos que ingiere y consumir alcohol con prudencia. Esta informacin no tiene como fin reemplazar el consejo del mdico. Asegrese de hacerle al mdico cualquier pregunta que tenga. Document Revised: 09/18/2019 Document Reviewed: 09/18/2019 Elsevier Patient Education  2021 [employer]vier Inc.  

## 2020-10-26 NOTE — Assessment & Plan Note (Signed)
Well-controlled hypertension. Continue Hyzaar 50-12.5 mg daily. Uncontrolled diabetes with hemoglobin A1c of 7.8. Continue Metformin 1000 mg twice a day and increase Farxiga to 10 mg once a day. Diet and nutrition discussed. Continue atorvastatin 40 mg daily. Continue daily baby aspirin. Follow-up in 3 months.

## 2020-10-26 NOTE — Progress Notes (Signed)
Barbera Setters 70 y.o.   Chief Complaint  Patient presents with  . Diabetes    Follow up 3 month  . Medication Refill    Farxiga, metformin, Atorvastatin, Losartan-Hctz and diabetic supplies    HISTORY OF PRESENT ILLNESS: This is a 70 y.o. male with history of diabetes and hypertension and dyslipidemia here for follow-up. #1 diabetes: On Metformin and [place]. Elevated blood glucose numbers at home. #2 hypertension: On Hyzaar 50-12.5 mg daily. Normal blood pressure readings at home. #3 dyslipidemia: On atorvastatin 40 mg daily Also takes 1 baby aspirin daily. #4 history of seasonal allergies. Montelukast not working and [facility] not working either. Has been using over-the-counter Vicks nasal spray with partial results. No other complaints or medical concerns today.  HPI   Prior to Admission medications   Medication Sig Start Date End Date Taking? Authorizing Provider  aspirin EC 81 MG tablet Take 81 mg by mouth daily.   Yes [provider]  atorvastatin (LIPITOR) 40 MG tablet Take 1 tablet (40 mg total) by mouth daily. 09/07/20  Yes Lashawnda Hancox, Ines Bloomer, MD  dapagliflozin propanediol (FARXIGA) 5 MG TABS tablet Take 1 tablet (5 mg total) by mouth daily before breakfast. 09/07/20  Yes Omarie Parcell, Ines Bloomer, MD  Dulaglutide (TRULICITY) 0.16 WF/0.9NA SOPN Inject 0.75 mg into the skin once a week. 07/27/20  Yes Sai Moura, Ines Bloomer, MD  Fish Oil OIL Take 1 capsule by mouth daily.   Yes [provider]  losartan-hydrochlorothiazide (HYZAAR) 50-12.5 MG tablet Take 1 tablet by mouth daily. 07/27/20  Yes Horald Pollen, MD  metFORMIN (GLUCOPHAGE) 1000 MG tablet Take 1 tablet (1,000 mg total) by mouth 2 (two) times daily with a meal. 07/27/20  Yes Lillia Lengel, Ines Bloomer, MD  Multiple Vitamin (MULTIVITAMIN) tablet Take 1 tablet by mouth daily.   Yes [provider]  omeprazole (PRILOSEC) 20 MG capsule Take 1 capsule (20 mg total) by mouth daily. 04/14/19  Yes  Fulp, Cammie, MD  triamcinolone (KENALOG) 0.1 % Apply 1 application topically 2 (two) times daily. 09/07/20  Yes Arleny Kruger, Ines Bloomer, MD  fluticasone [facility]) 50 MCG/ACT nasal spray Place 2 sprays into both nostrils daily. Patient not taking: Reported on 10/26/2020 09/23/19   Ladell Pier, MD  glucose blood test strip Use to check FSBS daily. Dx: E11.9 09/23/19   Ladell Pier, MD  Lancets MISC OneTouch Delica Lancets 33 gauge  check glucose levels 2-3 times a day DX: E11.65, NPI [id number], Date: 03/21/17 09/23/19   Ladell Pier, MD  montelukast (SINGULAIR) 10 MG tablet Take 1 tablet (10 mg total) by mouth at bedtime. Patient not taking: Reported on 10/26/2020 04/14/19   Antony Blackbird, MD    Not on File  Patient Active Problem List   Diagnosis Date Noted  . Class 1 obesity due to excess calories with serious comorbidity and body mass index (BMI) of 32.0 to 32.9 in adult 09/23/2019  . Allergic rhinitis 03/28/2019  . Hypertension associated with diabetes ([place]) 10/03/2018  . Dyslipidemia associated with type 2 diabetes mellitus ([place]) 10/03/2018  . Dyslipidemia 10/03/2018  . General weakness 10/03/2018  . ED (erectile dysfunction) 01/22/2013  . GERD (gastroesophageal reflux disease) 10/24/2012    Past Medical History:  Diagnosis Date  . Allergy   . Benign paroxysmal positional vertigo   . Diabetes mellitus without complication ([place])   . GERD (gastroesophageal reflux disease)   . Hyperlipidemia   . Hypertension     Past Surgical History:  Procedure Laterality Date  .  COLONOSCOPY     11-12 yrs ago- normal per pt-pt doesnt remeber where he had the colonoscopy   . HERNIA REPAIR      Social History   Socioeconomic History  . Marital status: Married    Spouse name: Not on file  . Number of children: Not on file  . Years of education: Not on file  . Highest education level: Not on file  Occupational History  . Not on file  Tobacco Use  . Smoking status: Current  Some Day Smoker  . Smokeless tobacco: Never Used  . Tobacco comment: 0-2/day  Vaping Use  . Vaping Use: Never used  Substance and Sexual Activity  . Alcohol use: No  . Drug use: No  . Sexual activity: Not on file  Other Topics Concern  . Not on file  Social History Narrative  . Not on file   Social Determinants of Health   Financial Resource Strain: Not on file  Food Insecurity: Not on file  Transportation Needs: Not on file  Physical Activity: Not on file  Stress: Not on file  Social Connections: Not on file  Intimate Partner Violence: Not on file    Family History  Problem Relation Age of Onset  . Colon cancer Neg Hx   . Colon polyps Neg Hx   . Esophageal cancer Neg Hx   . Rectal cancer Neg Hx   . Stomach cancer Neg Hx      Review of Systems  Constitutional: Negative.  Negative for chills and fever.  HENT: Negative.  Negative for congestion and sore throat.   Respiratory: Negative.  Negative for cough and shortness of breath.   Cardiovascular: Negative.  Negative for chest pain and palpitations.  Gastrointestinal: Negative for abdominal pain, nausea and vomiting.  Genitourinary: Negative.  Negative for dysuria.  Skin: Negative.  Negative for rash.  Neurological: Negative.  Negative for dizziness and headaches.  Endo/Heme/Allergies: Positive for environmental allergies.  All other systems reviewed and are negative.   Today's Vitals   10/26/20 0905  BP: (!) 152/67  Pulse: 72  Resp: 16  Temp: 98.4 F (36.9 C)  TempSrc: Temporal  SpO2: 98%  Weight: 181 lb (82.1 kg)  Height: '5\' 3"'  (1.6 m)   Body mass index is 32.06 kg/m. Wt Readings from Last 3 Encounters:  10/26/20 181 lb (82.1 kg)  09/07/20 189 lb (85.7 kg)  07/27/20 186 lb (84.4 kg)    Physical Exam Vitals reviewed.  HENT:     Head: Normocephalic.  Eyes:     Extraocular Movements: Extraocular movements intact.     Pupils: Pupils are equal, round, and reactive to light.  Cardiovascular:      Rate and Rhythm: Normal rate and regular rhythm.     Pulses: Normal pulses.     Heart sounds: Normal heart sounds.  Pulmonary:     Effort: Pulmonary effort is normal.     Breath sounds: Normal breath sounds.  Abdominal:     Palpations: Abdomen is soft.     Tenderness: There is no abdominal tenderness.  Musculoskeletal:        General: Normal range of motion.     Cervical back: Normal range of motion and neck supple.  Skin:    General: Skin is warm and dry.     Capillary Refill: Capillary refill takes less than 2 seconds.  Neurological:     General: No focal deficit present.     Mental Status: He is alert and oriented to  person, place, and time.  Psychiatric:        Mood and Affect: Mood normal.        Behavior: Behavior normal.     Results for orders placed or performed in visit on 10/26/20 (from the past 24 hour(s))  POCT glucose (manual entry)     Status: Abnormal   Collection Time: 10/26/20  9:13 AM  Result Value Ref Range   POC Glucose 207 (A) 70 - 99 mg/dl  POCT glycosylated hemoglobin (Hb A1C)     Status: Abnormal   Collection Time: 10/26/20  9:18 AM  Result Value Ref Range   Hemoglobin A1C 7.8 (A) 4.0 - 5.6 %   HbA1c POC (<> result, manual entry)     HbA1c, POC (prediabetic range)     HbA1c, POC (controlled diabetic range)     A total of 30 minutes was spent with the patient, greater than 50% of which was in counseling/coordination of care regarding diabetes, hypertension, and dyslipidemia and cardiovascular risks associated with these conditions, review of all medications and changes made including increasing dose of Farxiga to 10 mg daily, review of most recent blood work results including today's hemoglobin A1c, review of most recent office visit notes, education on nutrition, treatment of seasonal allergies and new nasal spray called Dymista, health maintenance items, prognosis, documentation, need for follow-up.  ASSESSMENT & PLAN: Hypertension associated with  diabetes ([place]) Well-controlled hypertension. Continue Hyzaar 50-12.5 mg daily. Uncontrolled diabetes with hemoglobin A1c of 7.8. Continue Metformin 1000 mg twice a day and increase Farxiga to 10 mg once a day. Diet and nutrition discussed. Continue atorvastatin 40 mg daily. Continue daily baby aspirin. Follow-up in 3 months.  Revanth was seen today for diabetes and medication refill.  Diagnoses and all orders for this visit:  Hypertension associated with diabetes ([place]) -     CMP14+EGFR -     POCT glucose (manual entry) -     POCT glycosylated hemoglobin (Hb A1C) -     glucose blood test strip; Use to check FSBS daily. Dx: E11.9 -     Lancets MISC; OneTouch Delica Lancets 33 gauge  check glucose levels 2-3 times a day DX: E11.65, NPI [id number], Date: 03/21/17 -     dapagliflozin propanediol (FARXIGA) 10 MG TABS tablet; Take 1 tablet (10 mg total) by mouth daily before breakfast.  Dyslipidemia associated with type 2 diabetes mellitus (HCC) -     Lipid panel -     atorvastatin (LIPITOR) 40 MG tablet; Take 1 tablet (40 mg total) by mouth daily.  Essential hypertension -     losartan-hydrochlorothiazide (HYZAAR) 50-12.5 MG tablet; Take 1 tablet by mouth daily.  Type 2 diabetes mellitus with hyperglycemia, without long-term current use of insulin (HCC)  Seasonal allergies -     Azelastine-Fluticasone (DYMISTA) 137-50 MCG/ACT SUSP; Spray once in each nostril twice a day    Patient Instructions       If you have lab work done today you will be contacted with your lab results within the next 2 weeks.  If you have not heard from [place] then please contact us. The fastest way to get your results is to register for My Chart.   IF you received an x-ray today, you will receive an invoice from [facility]. Please contact [facility] at [phone] with questions or concerns regarding your invoice.   IF you received labwork today, you will receive an invoice from  [facility]. Please contact [facility] at [phone] with questions or  concerns regarding your invoice.   Our billing staff will not be able to assist you with questions regarding bills from these companies.  You will be contacted with the lab results as soon as they are available. The fastest way to get your results is to activate your My Chart account. Instructions are located on the last page of this paperwork. If you have not heard from [place] regarding the results in 2 weeks, please contact this office.     Diabetes mellitus y nutricin, en adultos Diabetes Mellitus and Nutrition, Adult Si sufre de diabetes, o diabetes mellitus, es muy importante tener hbitos alimenticios saludables debido a que sus niveles de Designer, television/film set sangre (glucosa) se ven afectados en gran medida por lo que come y bebe. [place] alimentos saludables en las cantidades correctas, aproximadamente a la misma hora todos los [place] ayudar a:  Aeronautical engineer glucemia.  Disminuir el riesgo de sufrir una enfermedad cardaca.  Mejorar la presin arterial.  Science writer o mantener un peso saludable. Qu puede afectar mi plan de alimentacin? Todas las personas que sufren de diabetes son diferentes y cada una tiene necesidades diferentes en cuanto a un plan de alimentacin. El mdico puede recomendarle que trabaje con un nutricionista para elaborar el mejor plan para usted. Su plan de alimentacin puede variar segn factores como:  Las caloras que necesita.  Los medicamentos que toma.  Su peso.  Sus niveles de glucemia, presin arterial y colesterol.  Su nivel de [place].  Otras afecciones que tenga, como enfermedades cardacas o renales. Cmo me afectan los carbohidratos? Los carbohidratos, o hidratos de carbono, afectan su nivel de glucemia ms que cualquier otro tipo de alimento. La ingesta de carbohidratos naturalmente aumenta la cantidad de [employer]. El recuento de carbohidratos es un mtodo destinado a  Catering manager un registro de la cantidad de carbohidratos que se consumen. El recuento de carbohidratos es importante para Theatre manager la glucemia a un nivel saludable, especialmente si utiliza insulina o toma determinados medicamentos por va oral para la diabetes. Es importante conocer la cantidad de carbohidratos que se pueden ingerir en cada comida sin correr Engineer, manufacturing. Esto es Psychologist, forensic. Su nutricionista puede ayudarlo a calcular la cantidad de carbohidratos que debe ingerir en cada comida y en cada refrigerio. Cmo me afecta el alcohol? El alcohol puede provocar disminuciones sbitas de la glucemia (hipoglucemia), especialmente si utiliza insulina o toma determinados medicamentos por va oral para la diabetes. La hipoglucemia es una afeccin potencialmente mortal. Los sntomas de la hipoglucemia, como somnolencia, mareos y confusin, son similares a los sntomas de haber consumido demasiado alcohol.  No beba alcohol si: ? Su mdico le indica no hacerlo. ? Est embarazada, puede estar embarazada o est tratando de quedar embarazada.  Si bebe alcohol: ? No beba con el estmago vaco. ? Limite la cantidad que bebe:  De 0 a 1 medida por da para las mujeres.  De 0 a 2 medidas por da para los hombres. ? Est atento a la cantidad de alcohol que hay en las bebidas que toma. En los [place], una medida equivale a una botella de cerveza de 12oz (366m), un vaso de vino de 5oz (142m o un vaso de una bebida alcohlica de alta graduacin de 1oz (4439m ? Mantngase hidratado bebiendo agua, refrescos dietticos o t helado sin azcar.  Tenga en cuenta que los refrescos comunes, los jugos y otras bebida para mezOptician, dispensingeden contener mucha azcar y se deben contar como carbohidratos.  Consejos para seguir Photographer las etiquetas de los alimentos  Comience por leer el tamao de la porcin en la "Informacin nutricional" en las etiquetas de los alimentos envasados y las  bebidas. La cantidad de caloras, carbohidratos, grasas y otros nutrientes mencionados en la etiqueta se basan en una porcin del alimento. Muchos alimentos contienen ms de una porcin por envase.  Verifique la cantidad total de gramos (g) de carbohidratos totales en una porcin. Puede calcular la cantidad de porciones de carbohidratos al dividir el total de carbohidratos por 15. Por ejemplo, si un alimento tiene un total de 30g de carbohidratos totales por porcin, equivale a 2 porciones de carbohidratos.  Verifique la cantidad de gramos (g) de grasas saturadas y grasas trans de una porcin. Escoja alimentos que no contengan estas grasas o que su contenido de estas sea [place].  Verifique la cantidad de miligramos (mg) de sal (sodio) en una porcin. [place] [employer] de las personas deben limitar la ingesta de sodio total a menos de 2374m por dTraining and development officer  Siempre consulte la informacin nutricional de los alimentos etiquetados como "con bajo contenido de grasa" o "sin grasa". Estos alimentos pueden tener un mayor contenido de aLocation manageragregada o carbohidratos refinados, y deben evitarse.  Hable con su nutricionista para identificar sus objetivos diarios en cuanto a los nutrientes mencionados en la etiqueta. Al ir de compras  Evite comprar alimentos procesados, enlatados o precocidos. Estos alimentos tienden a tSpecial educational needs teachermayor cantidad de gCannon Ball sodio y azcar agregada.  Compre en la zona exterior de la tienda de comestibles. Esta es la zona donde se encuentran con mayor frecuencia las frutas y las verduras frescas, los cereales a granel, las carnes frescas y los productos lcteos frescos. Al cocinar  Utilice mtodos de coccin a baja temperatura, como hornear, en lugar de mtodos de coccin a alta temperatura, como frer en abundante aceite.  Cocine con aceites saludables, como el aceite de oW[place] canola o gBush[place]  Evite cocinar con manteca, crema o carnes con alto contenido de grasa. Planificacin de las  comidas  Coma las comidas y los refrigerios regularmente, preferentemente a la misma hora todos lAnchorage Evite pasar largos perodos de tiempo sin comer.  Consuma alimentos ricos en fibra, como frutas frescas, verduras, frijoles y cereales integrales. Consulte a su nutricionista sobre cuntas porciones de carbohidratos puede consumir en cada comida.  Consuma entre 4 y 6 onzas (entre 112 y 168g) de protenas magras por da, como carnes magras, pollo, pescado, huevos o tofu. Una onza (oz) de protena magra equivale a: ? 1 onza (28g) de carne, pollo o pescado. ? 1huevo. ?  de taza (62 g) de tofu.  Coma algunos alimentos por da que contengan grasas saludables, como aguacates, frutos secos, semillas y pescado.   Qu alimentos debo comer? FLambert ModyBayas. Manzanas. Naranjas. Duraznos. Damascos. Ciruelas. Uvas. Mango. Papaya. G[place] Kiwi. Cerezas. VHolland CommonsLValeda Malm Espinaca. Verduras de hBoeing que incluyen col rizada, aGroveland hojas de bIraqy de mMcGrew Remolachas. Coliflor. Repollo. Brcoli. Zanahorias. Judas verdes. Tomates. Pimientos. Cebollas. Pepinos. Coles de Bruselas. Granos Granos integrales, como panes, galletas, tortillas, cereales y pastas de salvado o integrales. Avena sin azcar. Quinua. Arroz integral o salvaje. Carnes y oPsychiatric nurse Carne de ave sin piel. Cortes magros de ave y carne de res. Tofu. Frutos secos. Semillas. Lcteos Productos lcteos sin grasa o con bajo contenido de gSpringfield cFort Hill yogur y qCitrus Es posible que los productos que se enumeran ms aNew Caledoniano constituyan una lista cDothande  los alimentos y las bebidas que puede tomar. Consulte a un nutricionista para obtener ms informacin. Qu alimentos debo evitar? Lambert Mody Frutas enlatadas al almbar. Verduras Verduras enlatadas. Verduras congeladas con mantequilla o salsa de crema. Granos Productos elaborados con [place] y [place], como panes, pastas, bocadillos y  cereales. Evite todos los alimentos procesados. Carnes y otras protenas Cortes de carne con alto contenido de Lobbyist. Carne de ave con piel. Carnes empanizadas o fritas. Carne procesada. Evite las grasas saturadas. Lcteos Yogur, [place] enteros. Bebidas Bebidas azucaradas, como gaseosas o t helado. Es posible que los productos que se enumeran ms [place] no constituyan una lista completa de los alimentos y las bebidas que Nurse, adult. Consulte a un nutricionista para obtener ms informacin. Preguntas para hacerle al mdico  Es necesario que me rena con Radio broadcast assistant en el cuidado de la diabetes?  Es necesario que me rena con un nutricionista?  A qu nmero puedo llamar si tengo preguntas?  Cules son los mejores momentos para controlar la glucemia? Dnde encontrar ms informacin:  Asociacin Estadounidense de la Diabetes (American Diabetes Association): diabetes.org  Academy of Nutrition and Dietetics (Academia de Nutricin y Information systems manager): www.eatright.CSX Corporation of Diabetes and Digestive and Kidney Diseases ([place] la Diabetes y Commerce y [place]): DesMoinesFuneral.dk  Association of Diabetes Care and Education Specialists (Asociacin de Especialistas en Atencin y Educacin sobre la Diabetes): www.diabeteseducator.org Resumen  Es importante tener hbitos alimenticios saludables debido a que sus niveles de Designer, television/film set sangre (glucosa) se ven afectados en gran medida por lo que come y bebe.  Un plan de alimentacin saludable lo ayudar a controlar la glucemia y Theatre manager un estilo de vida saludable.  El mdico puede recomendarle que trabaje con un nutricionista para elaborar el mejor plan para usted.  Tenga en cuenta que los carbohidratos (hidratos de carbono) y el alcohol tienen efectos inmediatos en sus niveles de glucemia. Es importante contar los carbohidratos que ingiere y consumir alcohol con prudencia. Esta  informacin no tiene Marine scientist el consejo del mdico. Asegrese de hacerle al mdico cualquier pregunta que tenga. Document Revised: 09/18/2019 Document Reviewed: 09/18/2019 Elsevier Patient Education  2021 Elsevier Inc.      Agustina Caroli, MD [facility]

## 2020-10-27 LAB — CMP14+EGFR
ALT: 23 IU/L (ref 0–44)
AST: 22 IU/L (ref 0–40)
Albumin/Globulin Ratio: 1.8 (ref 1.2–2.2)
Albumin: 4.5 g/dL (ref 3.8–4.8)
Alkaline Phosphatase: 110 IU/L (ref 44–121)
BUN/Creatinine Ratio: 16 (ref 10–24)
BUN: 16 mg/dL (ref 8–27)
Bilirubin Total: 0.3 mg/dL (ref 0.0–1.2)
CO2: 22 mmol/L (ref 20–29)
Calcium: 10.2 mg/dL (ref 8.6–10.2)
Chloride: 100 mmol/L (ref 96–106)
Creatinine, Ser: 0.97 mg/dL (ref 0.76–1.27)
Globulin, Total: 2.5 g/dL (ref 1.5–4.5)
Glucose: 198 mg/dL — ABNORMAL HIGH (ref 65–99)
Potassium: 4.6 mmol/L (ref 3.5–5.2)
Sodium: 137 mmol/L (ref 134–144)
Total Protein: 7 g/dL (ref 6.0–8.5)
eGFR: 85 mL/min/{1.73_m2} (ref >59)

## 2020-10-27 LAB — LIPID PANEL
Chol/HDL Ratio: 3.7 ratio (ref 0.0–5.0)
Cholesterol, Total: 112 mg/dL (ref 100–199)
HDL: 30 mg/dL — ABNORMAL LOW (ref >39)
LDL Chol Calc (NIH): 47 mg/dL (ref 0–99)
Triglycerides: 218 mg/dL — ABNORMAL HIGH (ref 0–149)
VLDL Cholesterol Cal: 35 mg/dL (ref 5–40)

## 2020-12-14 IMAGING — US ULTRASOUND ABDOMEN LIMITED
1 series · 14 of 25 positions shown · non-contrast
Comparison: None.

CLINICAL DATA: Elevated liver enzymes

EXAM:
ULTRASOUND ABDOMEN LIMITED RIGHT UPPER QUADRANT

[Series 1: ultrasound abdomen limited · 14 of 47 slices shown]
[im 1/47]
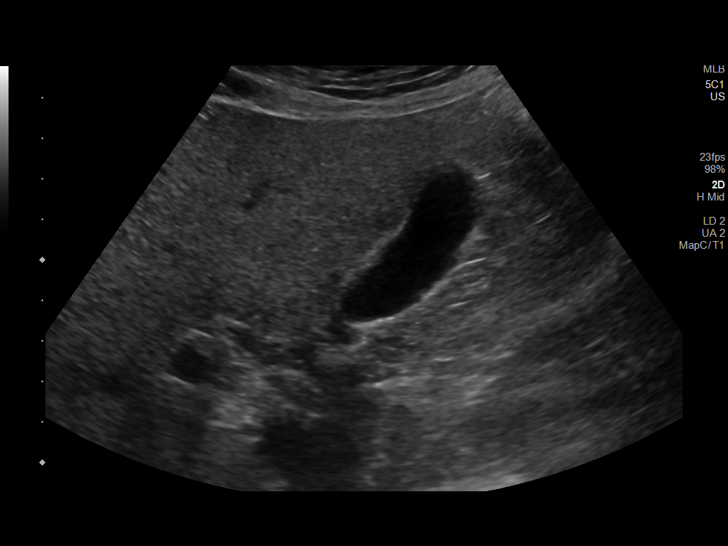
[im 4/47]
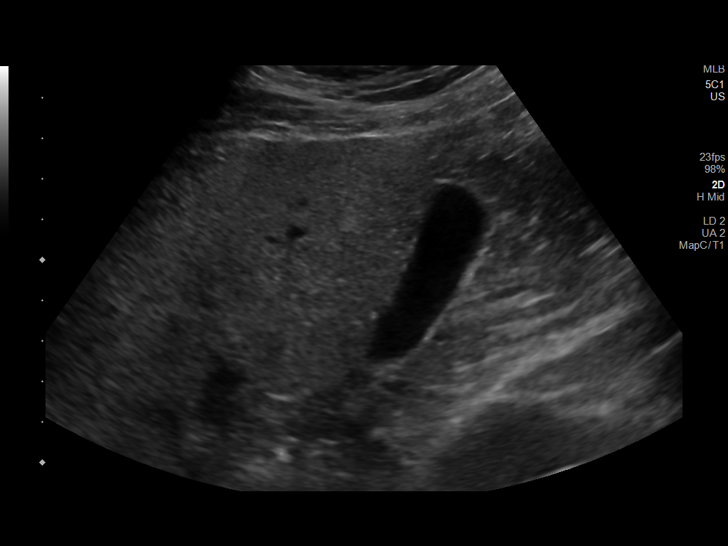
[im 8/47]
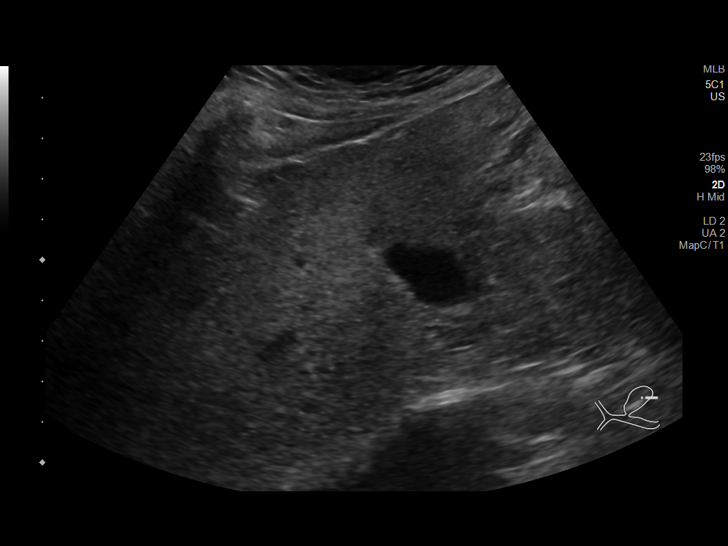
[im 12/47]
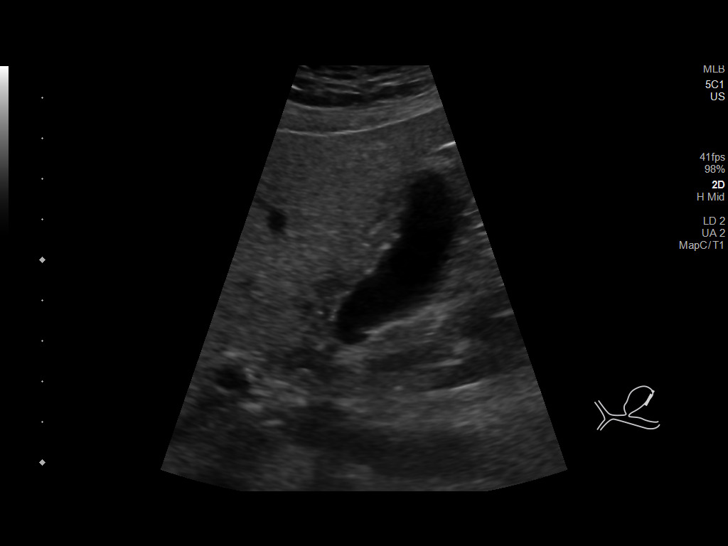
[im 16/47]
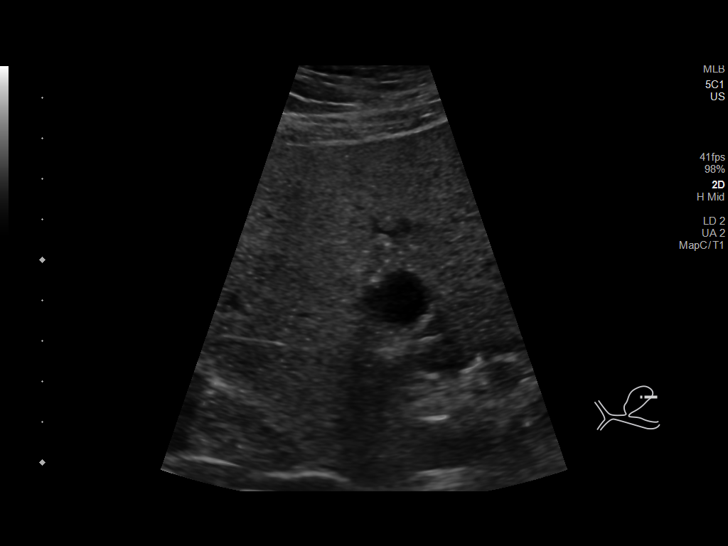
[im 18/47]
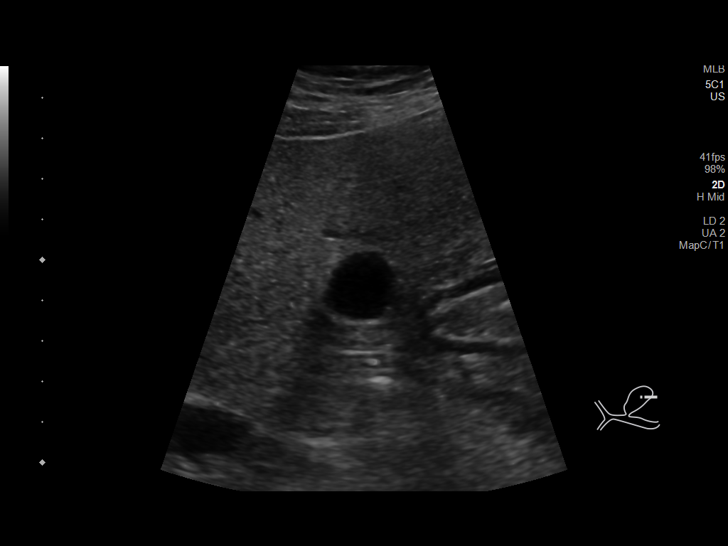
[im 22/47]
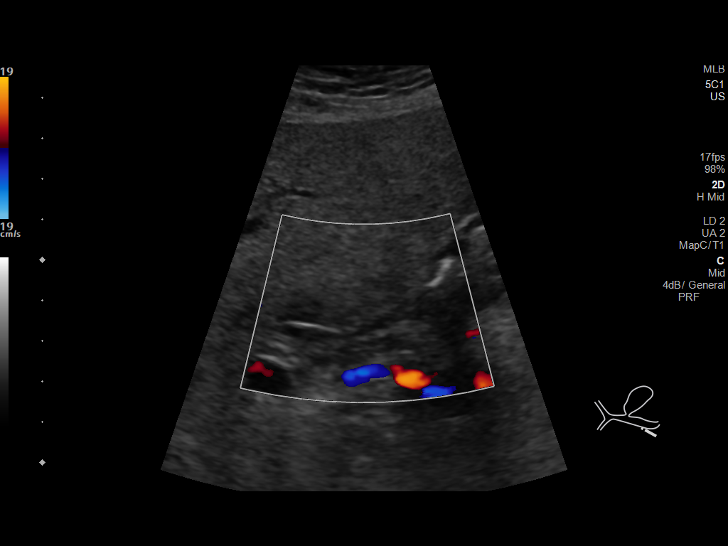
[im 25/47]
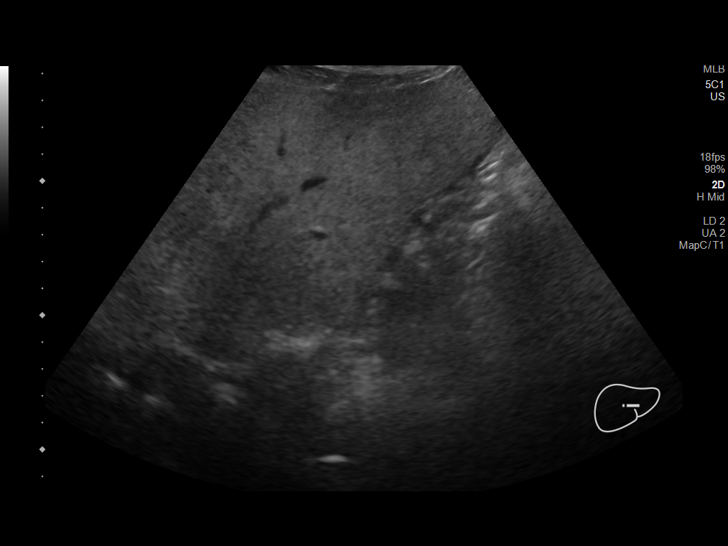
[im 29/47]
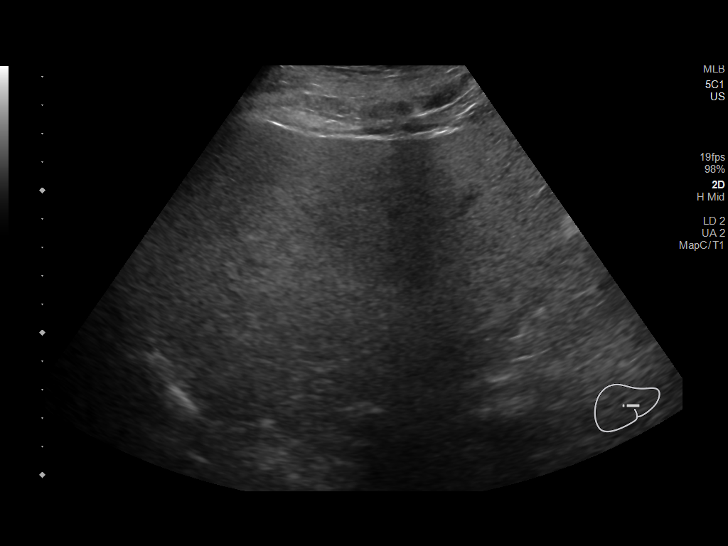
[im 31/47]
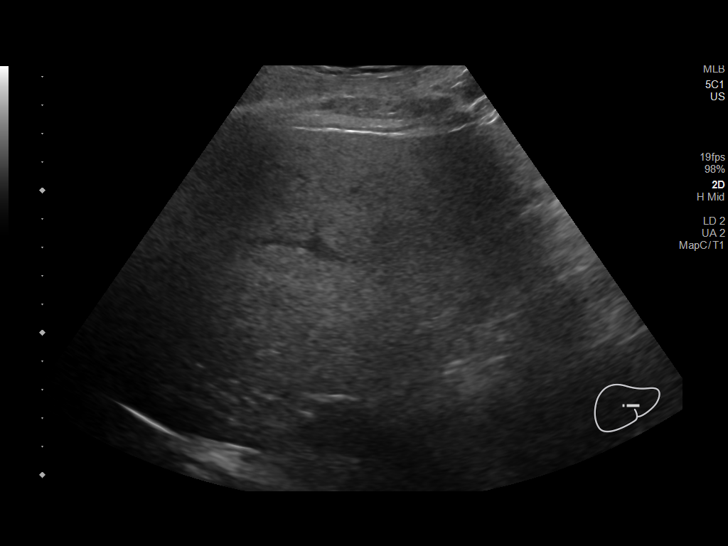
[im 35/47]
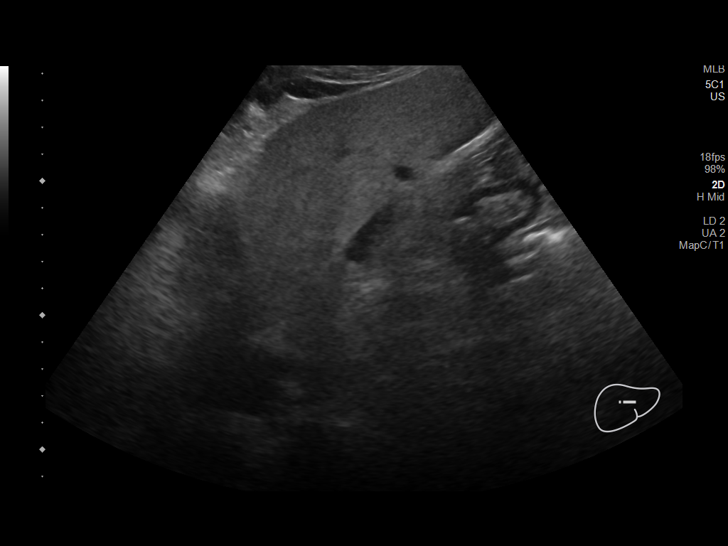
[im 39/47]
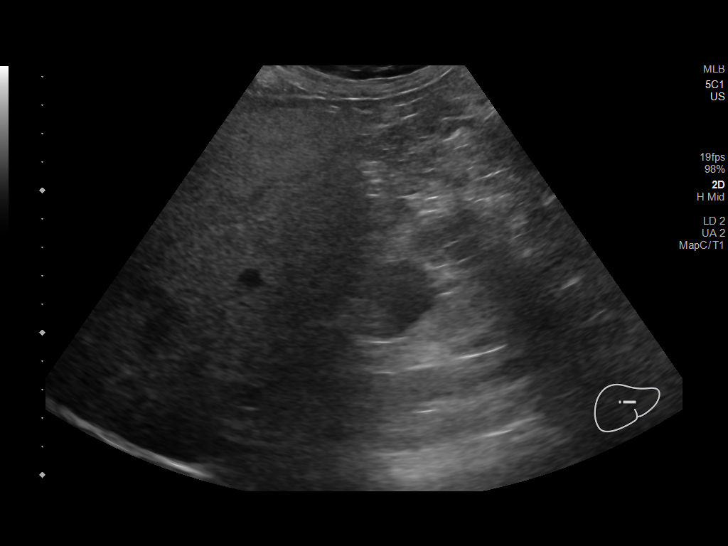
[im 43/47]
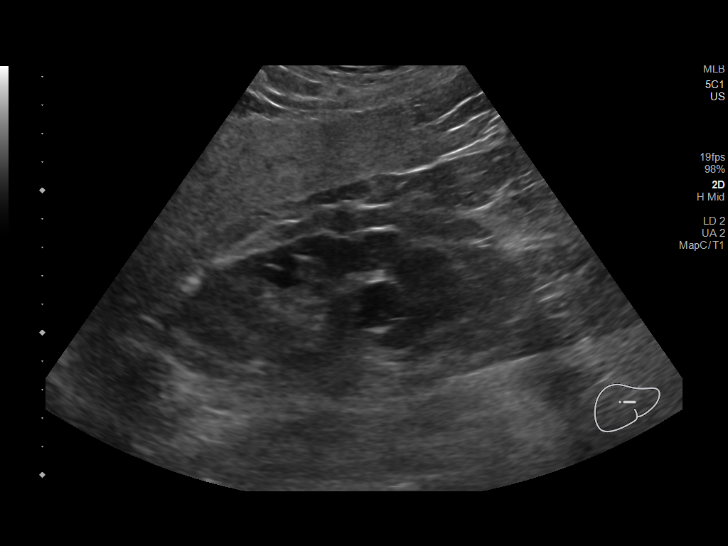
[im 47/47]
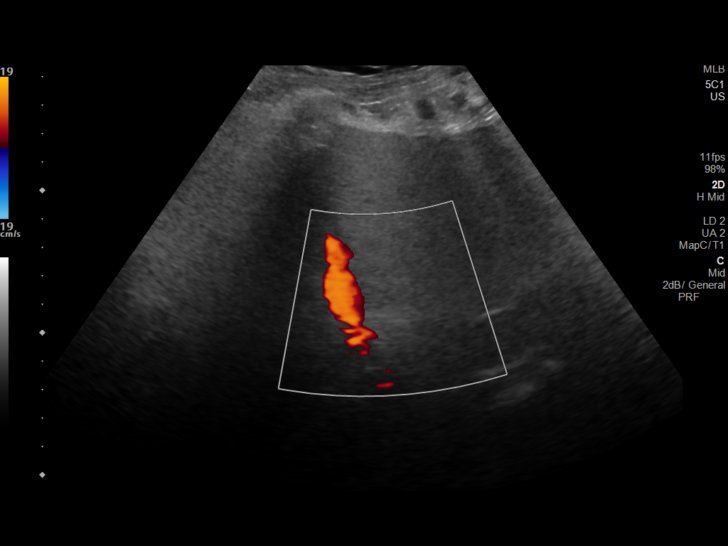

[14 of 25 positions shown; findings below may reference images not displayed]

FINDINGS: Gallbladder:

No gallstones or wall thickening visualized. There is no
pericholecystic fluid. No sonographic Murphy sign noted by
sonographer.

Common bile duct:

Diameter: 5 mm. No intrahepatic or extrahepatic biliary duct
dilatation.

Liver:

No focal lesion identified. Liver echogenicity overall is
increased. Portal vein is patent on color Doppler imaging with
normal direction of blood flow towards the liver.

There is a cyst arising from the right kidney measuring 3.1 x 2.7 x
3.0 cm.
IMPRESSION: Increase in liver echogenicity, a finding indicative of hepatic
steatosis. While no focal liver lesions are evident on this study,
it must be cautioned that the sensitivity of ultrasound for
detection of focal liver lesions is diminished in this circumstance.

Cyst arising from right kidney measuring 3.1 x 2.7 x 3.0 cm.

Study otherwise unremarkable.

## 2021-01-26 ENCOUNTER — Ambulatory Visit: Payer: Self-pay | Admitting: Emergency Medicine

## 2021-01-26 ENCOUNTER — Encounter: Payer: Self-pay | Admitting: Emergency Medicine

## 2021-01-26 NOTE — Progress Notes (Signed)
Lab Results  Component Value Date   HGBA1C 7.8 (A) 10/26/2020   BP Readings from Last 3 Encounters:  10/26/20 130/60  09/07/20 136/69  07/27/20 (!) 166/75   Lab Results  Component Value Date   CREATININE 0.97 10/26/2020   BUN 16 10/26/2020   NA 137 10/26/2020   K 4.6 10/26/2020   CL 100 10/26/2020   CO2 22 10/26/2020   Lab Results  Component Value Date   CHOL 112 10/26/2020   HDL 30 (L) 10/26/2020   LDLCALC 47 10/26/2020   TRIG 218 (H) 10/26/2020   CHOLHDL 3.7 10/26/2020

## 2021-02-15 ENCOUNTER — Encounter: Payer: Self-pay | Admitting: Emergency Medicine

## 2021-02-15 ENCOUNTER — Ambulatory Visit (INDEPENDENT_AMBULATORY_CARE_PROVIDER_SITE_OTHER): Payer: Medicare Other | Admitting: Emergency Medicine

## 2021-02-15 ENCOUNTER — Other Ambulatory Visit: Payer: Self-pay

## 2021-02-15 VITALS — BP 122/64 | HR 85 | Temp 98.1°F | Ht 63.0 in | Wt 177.4 lb

## 2021-02-15 DIAGNOSIS — E1165 Type 2 diabetes mellitus with hyperglycemia: Secondary | ICD-10-CM | POA: Diagnosis not present

## 2021-02-15 DIAGNOSIS — I152 Hypertension secondary to endocrine disorders: Secondary | ICD-10-CM

## 2021-02-15 DIAGNOSIS — E1159 Type 2 diabetes mellitus with other circulatory complications: Secondary | ICD-10-CM

## 2021-02-15 DIAGNOSIS — I10 Essential (primary) hypertension: Secondary | ICD-10-CM

## 2021-02-15 DIAGNOSIS — E785 Hyperlipidemia, unspecified: Secondary | ICD-10-CM | POA: Diagnosis not present

## 2021-02-15 DIAGNOSIS — E1169 Type 2 diabetes mellitus with other specified complication: Secondary | ICD-10-CM | POA: Diagnosis not present

## 2021-02-15 LAB — CBC WITH DIFFERENTIAL/PLATELET
Basophils Absolute: 0 10*3/uL (ref 0.0–0.1)
Basophils Relative: 0.3 % (ref 0.0–3.0)
Eosinophils Absolute: 0.1 10*3/uL (ref 0.0–0.7)
Eosinophils Relative: 1.3 % (ref 0.0–5.0)
HCT: 47.4 % (ref 39.0–52.0)
Hemoglobin: 16.3 g/dL (ref 13.0–17.0)
Lymphocytes Relative: 23.4 % (ref 12.0–46.0)
Lymphs Abs: 2.2 10*3/uL (ref 0.7–4.0)
MCHC: 34.3 g/dL (ref 30.0–36.0)
MCV: 91.3 fl (ref 78.0–100.0)
Monocytes Absolute: 0.7 10*3/uL (ref 0.1–1.0)
Monocytes Relative: 7.2 % (ref 3.0–12.0)
Neutro Abs: 6.3 10*3/uL (ref 1.4–7.7)
Neutrophils Relative %: 67.8 % (ref 43.0–77.0)
Platelets: 216 10*3/uL (ref 150.0–400.0)
RBC: 5.19 Mil/uL (ref 4.22–5.81)
RDW: 15.4 % (ref 11.5–15.5)
WBC: 9.2 10*3/uL (ref 4.0–10.5)

## 2021-02-15 LAB — COMPREHENSIVE METABOLIC PANEL
ALT: 24 U/L (ref 0–53)
AST: 21 U/L (ref 0–37)
Albumin: 4.2 g/dL (ref 3.5–5.2)
Alkaline Phosphatase: 107 U/L (ref 39–117)
BUN: 18 mg/dL (ref 6–23)
CO2: 26 mEq/L (ref 19–32)
Calcium: 9.6 mg/dL (ref 8.4–10.5)
Chloride: 103 mEq/L (ref 96–112)
Creatinine, Ser: 1.02 mg/dL (ref 0.40–1.50)
GFR: 75.55 mL/min (ref >60.00)
Glucose, Bld: 209 mg/dL — ABNORMAL HIGH (ref 70–99)
Potassium: 4.2 mEq/L (ref 3.5–5.1)
Sodium: 137 mEq/L (ref 135–145)
Total Bilirubin: 0.4 mg/dL (ref 0.2–1.2)
Total Protein: 7 g/dL (ref 6.0–8.3)

## 2021-02-15 LAB — LIPID PANEL
Cholesterol: 133 mg/dL (ref 0–200)
HDL: 32 mg/dL — ABNORMAL LOW (ref >39.00)
Total CHOL/HDL Ratio: 4
Triglycerides: 469 mg/dL — ABNORMAL HIGH (ref 0.0–149.0)

## 2021-02-15 LAB — TSH: TSH: 1.84 u[IU]/mL (ref 0.35–4.50)

## 2021-02-15 LAB — POCT GLYCOSYLATED HEMOGLOBIN (HGB A1C): Hemoglobin A1C: 7.8 % — AB (ref 4.0–5.6)

## 2021-02-15 LAB — LDL CHOLESTEROL, DIRECT: Direct LDL: 66 mg/dL

## 2021-02-15 MED ORDER — ATORVASTATIN CALCIUM 40 MG PO TABS: 40.0000 mg | ORAL_TABLET | Freq: Every day | ORAL | 3 refills | Status: AC

## 2021-02-15 MED ORDER — METFORMIN HCL 1000 MG PO TABS: 1000.0000 mg | ORAL_TABLET | Freq: Two times a day (BID) | ORAL | 3 refills | Status: AC

## 2021-02-15 MED ORDER — LOSARTAN POTASSIUM-HCTZ 50-12.5 MG PO TABS: 1.0000 | ORAL_TABLET | Freq: Every day | ORAL | 3 refills | Status: AC

## 2021-02-15 NOTE — Progress Notes (Signed)
[place] Park 70 y.o.   Chief Complaint  Patient presents with   Diabetes   Sinusitis    Follow up 6 month   Medication Refill  ASSESSMENT & PLAN: Hypertension associated with diabetes (HCC) Well-controlled hypertension. Continue Hyzaar 50-12.5 mg daily. Uncontrolled diabetes with hemoglobin A1c of 7.8. Continue Metformin 1000 mg twice a day and increase Farxiga to 10 mg once a day. Diet and nutrition discussed. Continue atorvastatin 40 mg daily. Continue daily baby aspirin. Follow-up in 3 months.  HISTORY OF PRESENT ILLNESS: This is a 70 y.o. male with history of hypertension and diabetes here for follow-up and medication refill. Recently told he has a left ear infection.  Was treated for sinusitis. Diabetes doing better.  Patient on metformin 1000 mg daily and Farxiga 10 mg daily.  States morning sugars are about 100 and evening sugars are about 140s. Blood pressure not a concern.  Taking Hyzaar 50-12.5 mg daily and 1 daily baby aspirin. Also taking atorvastatin 40 mg daily. Overall feels much better. No other complaints or medical concerns today.  Diabetes Pertinent negatives for hypoglycemia include no dizziness or headaches. Pertinent negatives for diabetes include no chest pain.  Sinusitis Pertinent negatives include no chills, congestion, coughing, headaches, shortness of breath or sore throat.  Medication Refill Pertinent negatives include no abdominal pain, chest pain, chills, congestion, coughing, fever, headaches, nausea, rash, sore throat or vomiting.    Prior to Admission medications   Medication Sig Start Date End Date Taking? Authorizing Provider  aspirin EC 81 MG tablet Take 81 mg by mouth daily.    [provider]  atorvastatin (LIPITOR) 40 MG tablet Take 1 tablet (40 mg total) by mouth daily. 10/26/20   [name], MD  Azelastine-Fluticasone [facility]) [phone] MCG/ACT SUSP Spray once in each nostril twice a day 10/26/20   [name], MD  Fish Oil OIL Take 1 capsule by mouth daily.    [provider]  glucose blood test strip Use to check FSBS daily. Dx: E11.9 10/26/20   [name], MD  Lancets MISC OneTouch Delica Lancets 33 gauge  check glucose levels 2-3 times a day DX: E11.65, NPI [id number], Date: 03/21/17 10/26/20   [name], MD  losartan-hydrochlorothiazide [facility]) 50-12.5 MG tablet Take 1 tablet by mouth daily. 10/26/20   [name], MD  metFORMIN (GLUCOPHAGE) 1000 MG tablet Take 1 tablet (1,000 mg total) by mouth 2 (two) times daily with a meal. 07/27/20   Owyn Raulston, [name], MD  montelukast (SINGULAIR) 10 MG tablet Take 1 tablet (10 mg total) by mouth at bedtime. Patient not taking: Reported on 10/26/2020 04/14/19   Cain Saupe, MD  Multiple Vitamin (MULTIVITAMIN) tablet Take 1 tablet by mouth daily.    [provider]  omeprazole (PRILOSEC) 20 MG capsule Take 1 capsule (20 mg total) by mouth daily. 04/14/19   Fulp, Cammie, MD  triamcinolone (KENALOG) 0.1 % Apply 1 application topically 2 (two) times daily. 09/07/20   [name], MD    No Known Allergies  Patient Active Problem List   Diagnosis Date Noted   Class 1 obesity due to excess calories with serious comorbidity and body mass index (BMI) of 32.0 to 32.9 in adult 09/23/2019   Allergic rhinitis 03/28/2019   Hypertension associated with diabetes (HCC) 10/03/2018   Dyslipidemia associated with type 2 diabetes mellitus (HCC) 10/03/2018   Dyslipidemia 10/03/2018   General weakness 10/03/2018   ED (erectile dysfunction) 01/22/2013   GERD (gastroesophageal reflux  disease) 10/24/2012    Past Medical History:  Diagnosis Date   Allergy    Benign paroxysmal positional vertigo    Diabetes mellitus without complication (HCC)    GERD (gastroesophageal reflux disease)    Hyperlipidemia    Hypertension     Past Surgical History:  Procedure Laterality Date   COLONOSCOPY     11-12 yrs ago-  normal per pt-pt doesnt remeber where he had the colonoscopy    HERNIA REPAIR      Social History   Socioeconomic History   Marital status: Married    Spouse name: Not on file   Number of children: Not on file   Years of education: Not on file   Highest education level: Not on file  Occupational History   Not on file  Tobacco Use   Smoking status: Some Days    Pack years: 0.00   Smokeless tobacco: Never   Tobacco comments:    0-2/day  Vaping Use   Vaping Use: Never used  Substance and Sexual Activity   Alcohol use: No   Drug use: No   Sexual activity: Not on file  Other Topics Concern   Not on file  Social History Narrative   Not on file   Social Determinants of Health   Financial Resource Strain: Not on file  Food Insecurity: Not on file  Transportation Needs: Not on file  Physical Activity: Not on file  Stress: Not on file  Social Connections: Not on file  Intimate Partner Violence: Not on file    Family History  Problem Relation Age of Onset   Colon cancer Neg Hx    Colon polyps Neg Hx    Esophageal cancer Neg Hx    Rectal cancer Neg Hx    Stomach cancer Neg Hx      Review of Systems  Constitutional: Negative.  Negative for chills and fever.  HENT: Negative.  Negative for congestion and sore throat.   Respiratory: Negative.  Negative for cough and shortness of breath.   Cardiovascular:  Negative for chest pain and palpitations.  Gastrointestinal:  Negative for abdominal pain, diarrhea, nausea and vomiting.  Genitourinary: Negative.  Negative for dysuria and hematuria.  Skin: Negative.  Negative for rash.  Neurological:  Negative for dizziness and headaches.  All other systems reviewed and are negative.  Today's Vitals   02/15/21 1514  BP: 122/64  Pulse: 85  Temp: 98.1 F (36.7 C)  TempSrc: Oral  SpO2: 97%  Weight: 177 lb 6.4 oz (80.5 kg)  Height: 5\' 3"  (1.6 m)   Body mass index is 31.42 kg/m. Wt Readings from Last 3 Encounters:  02/15/21  177 lb 6.4 oz (80.5 kg)  10/26/20 181 lb (82.1 kg)  09/07/20 189 lb (85.7 kg)    Physical Exam Vitals reviewed.  Constitutional:      Appearance: Normal appearance.  HENT:     Head: Normocephalic.  Eyes:     Extraocular Movements: Extraocular movements intact.     Pupils: Pupils are equal, round, and reactive to light.  Cardiovascular:     Rate and Rhythm: Normal rate and regular rhythm.     Pulses: Normal pulses.     Heart sounds: Normal heart sounds.  Pulmonary:     Effort: Pulmonary effort is normal.     Breath sounds: Normal breath sounds.  Musculoskeletal:        General: Normal range of motion.     Cervical back: Normal range of motion and neck  supple.  Skin:    General: Skin is warm and dry.     Capillary Refill: Capillary refill takes less than 2 seconds.  Neurological:     General: No focal deficit present.     Mental Status: He is alert and oriented to person, place, and time.    Results for orders placed or performed in visit on 02/15/21 (from the past 24 hour(s))  POCT glycosylated hemoglobin (Hb A1C)     Status: Abnormal   Collection Time: 02/15/21  3:43 PM  Result Value Ref Range   Hemoglobin A1C 7.8 (A) 4.0 - 5.6 %   HbA1c POC (<> result, manual entry)     HbA1c, POC (prediabetic range)     HbA1c, POC (controlled diabetic range)      ASSESSMENT & PLAN: A total of 30 minutes was spent with the patient and counseling/coordination of care regarding diabetes and hypertension and cardiovascular risks associated with these conditions, review of most recent blood work results including today's hemoglobin A1c which is elevated, review of all medications and changes made including increasing dose of daily metformin, education on nutrition, review of most recent office visit notes, health maintenance items, prognosis, documentation and need for follow-up.  Hypertension associated with diabetes (HCC) Well-controlled hypertension.  Continue Hyzaar 50-12.5 mg  daily. Hemoglobin A1c still not at goal at 7.8.  Patient has been taking metformin 1000 mg only once a day.  Advised to start taking it twice a day and continue Farxiga 10 mg daily.  Diet and nutrition discussed. Follow-up in 3 months. Jarom was seen today for diabetes and medication refill.  Diagnoses and all orders for this visit:  Hypertension associated with diabetes (HCC) -     POCT glycosylated hemoglobin (Hb A1C) -     CBC with Differential/Platelet -     Comprehensive metabolic panel -     TSH  Dyslipidemia associated with type 2 diabetes mellitus (HCC) -     atorvastatin (LIPITOR) 40 MG tablet; Take 1 tablet (40 mg total) by mouth daily. -     Lipid panel -     TSH  Type 2 diabetes mellitus with hyperglycemia, without long-term current use of insulin (HCC) -     metFORMIN (GLUCOPHAGE) 1000 MG tablet; Take 1 tablet (1,000 mg total) by mouth 2 (two) times daily with a meal.  Essential hypertension -     losartan-hydrochlorothiazide (HYZAAR) 50-12.5 MG tablet; Take 1 tablet by mouth daily.  Patient Instructions  Diabetes mellitus y nutricin, en adultos Diabetes Mellitus and Nutrition, Adult Si sufre de diabetes, o diabetes mellitus, es muy importante tener hbitos alimenticios saludables debido a que sus niveles de Psychologist, counselling sangre (glucosa) se ven afectados en gran medida por lo que come y bebe. [place]er alimentos saludables en las cantidades correctas, aproximadamente a la misma hora todos los [place], [place] ayudar a: Scientist, physiological glucemia. Disminuir el riesgo de sufrir una enfermedad cardaca. Mejorar la presin arterial. Barista o mantener un peso saludable. Qu puede afectar mi plan de alimentacin? Todas las personas que sufren de diabetes son diferentes y cada una tiene necesidades diferentes en cuanto a un plan de alimentacin. El mdico puede recomendarle que trabaje con un nutricionista para elaborar el mejor plan para usted. Su plan de alimentacin puede variar  segn factores como: Las caloras que necesita. Los medicamentos que toma. Su peso. Sus niveles de glucemia, presin arterial y colesterol. Su nivel de [place]. Otras afecciones que tenga, como enfermedades cardacas o renales. Cmo  me afectan los carbohidratos? Los carbohidratos, o hidratos de carbono, afectan su nivel de glucemia ms que cualquier otro tipo de alimento. La ingesta de carbohidratos naturalmente aumenta la cantidad de [employer]. El recuento de carbohidratos es un mtodo destinado a Midwife un registro de la cantidad de carbohidratos que se consumen. El recuento de carbohidratos es importante para Pharmacologist la glucemia a un nivel saludable, especialmente si utiliza insulina o toma determinadosmedicamentos por va oral para la diabetes. Es importante conocer la cantidad de carbohidratos que se pueden ingerir en cada comida sin correr Surveyor, minerals. Esto es Government social research officer. Su nutricionista puede ayudarlo a calcular la cantidad de carbohidratos que debeingerir en cada comida y en cada refrigerio. Cmo me afecta el alcohol? El alcohol puede provocar disminuciones sbitas de la glucemia (hipoglucemia), especialmente si utiliza insulina o toma determinados medicamentos por va oral para la diabetes. La hipoglucemia es una afeccin potencialmente mortal. Los sntomas de la hipoglucemia, como somnolencia, mareos y confusin, son similares a los sntomas de haber consumido demasiado alcohol. No beba alcohol si: Su mdico le indica no hacerlo. Est embarazada, puede estar embarazada o est tratando de [place]. Si bebe alcohol: No beba con el estmago vaco. Limite la cantidad que bebe: De 0 a 1 medida por da para las mujeres. De 0 a 2 medidas por da para los hombres. Est atento a la cantidad de alcohol que hay en las bebidas que toma. En los [address], una medida equivale a una botella de cerveza de 12 oz (355 ml), un vaso de vino de 5 oz (148 ml) o  un vaso de una bebida alcohlica de alta graduacin de 1 oz (44 ml). Mantngase hidratado bebiendo agua, refrescos dietticos o t helado sin azcar. Tenga en cuenta que los refrescos comunes, los jugos y otras bebida para Engineer, manufacturing pueden contener mucha azcar y se deben contar como carbohidratos. Consejos para seguir Social worker las etiquetas de los alimentos Comience por leer el tamao de la porcin en la "Informacin nutricional" en las etiquetas de los alimentos envasados y las bebidas. La cantidad de caloras, carbohidratos, grasas y otros nutrientes mencionados en la etiqueta se basan en una porcin del alimento. Muchos alimentos contienen ms de una porcin por envase. Verifique la cantidad total de gramos (g) de carbohidratos totales en una porcin. Puede calcular la cantidad de porciones de carbohidratos al dividir el total de carbohidratos por 15. Por ejemplo, si un alimento tiene un total de 30 g de carbohidratos totales por porcin, equivale a 2 porciones de carbohidratos. Verifique la cantidad de gramos (g) de grasas saturadas y grasas trans de una porcin. Escoja alimentos que no contengan estas grasas o que su contenido de estas sea [place]. Verifique la cantidad de miligramos (mg) de sal (sodio) en una porcin. La [employer] de las personas deben limitar la ingesta de sodio total a menos de 2300 mg Google. Siempre consulte la informacin nutricional de los alimentos etiquetados como "con bajo contenido de grasa" o "sin grasa". Estos alimentos pueden tener un mayor contenido de International aid/development worker agregada o carbohidratos refinados, y deben evitarse. Hable con su nutricionista para identificar sus objetivos diarios en cuanto a los nutrientes mencionados en la etiqueta. Al ir de compras Evite comprar alimentos procesados, enlatados o precocidos. Estos alimentos tienden a Counselling psychologist mayor cantidad de [place], sodio y azcar agregada. Compre en la zona exterior de la tienda de comestibles. Esta es la zona  donde se encuentran con mayor frecuencia  las frutas y las verduras frescas, los cereales a granel, las carnes frescas y los productos lcteos frescos. Al cocinar Utilice mtodos de coccin a baja temperatura, como hornear, en lugar de mtodos de coccin a alta temperatura, como frer en abundante aceite. Cocine con aceites saludables, como el aceite de [place], canola o [place]. Evite cocinar con manteca, crema o carnes con alto contenido de grasa. Planificacin de las comidas Coma las comidas y los refrigerios regularmente, preferentemente a la misma hora todos [place]. Evite pasar largos perodos de tiempo sin comer. Consuma alimentos ricos en fibra, como frutas frescas, verduras, frijoles y cereales integrales. Consulte a su nutricionista sobre cuntas porciones de carbohidratos puede consumir en cada comida. Consuma entre 4 y 6 onzas (entre 112 y 168 g) de protenas magras por da, como carnes [place], pollo, pescado, huevos o tofu. Una onza (oz) de protena magra equivale a: 1 onza (28 g) de carne, pollo o pescado. 1 huevo.  de taza (62 g) de tofu. Coma algunos alimentos por da que contengan grasas saludables, como aguacates, frutos secos, semillas y pescado. Qu alimentos debo comer? Nils Pyle Bayas. Manzanas. Naranjas. Duraznos. Damascos. Ciruelas. Uvas. Mango. Papaya.Granada. Kiwi. Cerezas. Hoover Brunette Deatra James. Espinaca. Verduras de [employer], que incluyen col rizada, [place], hojas de [place] y de [place]. Remolachas. Coliflor. Repollo. Brcoli. Zanahorias. Judas verdes. Tomates. Pimientos. Cebollas. Pepinos. Coles deBruselas. Granos Granos integrales, como panes, galletas, tortillas, cereales y pastas desalvado o integrales. Avena sin azcar. Quinua. Arroz integral o salvaje. Carnes y Warehouse manager. Carne de ave sin piel. Cortes magros de ave y carne de res. Tofu.Frutos secos. Semillas. Lcteos Productos lcteos sin grasa o con bajo contenido de [place], [place], yogur  [place]. Es posible que los productos que se enumeran ms [place] no constituyan una lista completa de los alimentos y las bebidas que puede tomar. Consulte a un nutricionista para obtener ms informacin. Qu alimentos debo evitar? Nils Pyle Frutas enlatadas al almbar. Verduras Verduras enlatadas. Verduras congeladas con mantequilla o salsa de crema. Granos Productos elaborados con [place] y [place], como panes, pastas,bocadillos y cereales. Evite todos los alimentos procesados. Carnes y [address] de carne con alto contenido de Holiday representative. Carne de ave con piel. Carnesempanizadas o fritas. Carne procesada. Evite las grasas saturadas. Lcteos Yogur, queso o [employer] Health. Bebidas Bebidas azucaradas, como gaseosas o t helado. Es posible que los productos que se enumeran ms [place] no constituyan una lista completa de los alimentos y las bebidas que Personnel officer. Consulte a un nutricionista para obtener ms informacin. Preguntas para hacerle al mdico Es necesario que me rena con IT trainer en el cuidado de la diabetes? Es necesario que me rena con un nutricionista? A qu nmero puedo llamar si tengo preguntas? Cules son los mejores momentos para controlar la glucemia? Dnde encontrar ms informacin: Asociacin Estadounidense de la Diabetes (American Diabetes Association): diabetes.org Academy of Nutrition and Dietetics (Academia de Nutricin y Pension scheme manager): www.eatright.Dana Corporation of Diabetes and Digestive and Kidney Diseases [employer] de la Diabetes y las Enfermedades Digestivas y Renales): CarFlippers.tn Association of Diabetes Care and Education Specialists (Asociacin de Especialistas en Atencin y Educacin sobre la Diabetes): www.diabeteseducator.org Resumen Es importante tener hbitos alimenticios saludables debido a que sus niveles de Psychologist, counselling sangre (glucosa) se ven afectados en gran medida por lo que come y bebe. Un plan  de alimentacin saludable lo ayudar a controlar la glucemia y Pharmacologist un estilo de vida saludable. El mdico puede recomendarle que trabaje con un nutricionista  para elaborar el mejor plan para usted. Tenga en cuenta que los carbohidratos (hidratos de carbono) y el alcohol tienen efectos inmediatos en sus niveles de glucemia. Es importante contar los carbohidratos que ingiere y consumir alcohol con prudencia. Esta informacin no tiene Theme park manager el consejo del mdico. Asegresede hacerle al mdico cualquier pregunta que tenga. Document Revised: 09/18/2019 Document Reviewed: 09/18/2019 Elsevier Patient Education  2021 Elsevier Inc.   Edwina Barth, MD [facility]

## 2021-02-15 NOTE — Assessment & Plan Note (Signed)
Well-controlled hypertension.  Continue Hyzaar 50-12.5 mg daily. Hemoglobin A1c still not at goal at 7.8.  Patient has been taking metformin 1000 mg only once a day.  Advised to start taking it twice a day and continue Farxiga 10 mg daily.  Diet and nutrition discussed. Follow-up in 3 months.

## 2021-02-15 NOTE — Patient Instructions (Signed)
Diabetes mellitus y nutricin, en adultos Diabetes Mellitus and Nutrition, Adult Si sufre de diabetes, o diabetes mellitus, es muy importante tener hbitos alimenticios saludables debido a que sus niveles de azcar en la sangre (glucosa) se ven afectados en gran medida por lo que come y bebe. Comer alimentos saludables en las cantidades correctas, aproximadamente a la misma hora todos los das, lo ayudar a: Controlar la glucemia. Disminuir el riesgo de sufrir una enfermedad cardaca. Mejorar la presin arterial. Alcanzar o mantener un peso saludable. Qu puede afectar mi plan de alimentacin? Todas las personas que sufren de diabetes son diferentes y cada una tiene necesidades diferentes en cuanto a un plan de alimentacin. El mdico puede recomendarle que trabaje con un nutricionista para elaborar el mejor plan para usted. Su plan de alimentacin puede variar segn factores como: Las caloras que necesita. Los medicamentos que toma. Su peso. Sus niveles de glucemia, presin arterial y colesterol. Su nivel de actividad. Otras afecciones que tenga, como enfermedades cardacas o renales. Cmo me afectan los carbohidratos? Los carbohidratos, o hidratos de carbono, afectan su nivel de glucemia ms que cualquier otro tipo de alimento. La ingesta de carbohidratos naturalmente aumenta la cantidad de glucosa en la sangre. El recuento de carbohidratos es un mtodo destinado a llevar un registro de la cantidad de carbohidratos que se consumen. El recuento de carbohidratos es importante para mantener la glucemia a un nivel saludable, especialmente si utiliza insulina o toma determinados medicamentos por va oral para la diabetes. Es importante conocer la cantidad de carbohidratos que se pueden ingerir en cada comida sin correr ningn riesgo. Esto es diferente en cada persona. Su nutricionista puede ayudarlo a calcular la cantidad de carbohidratos que debe ingerir en cada comida y en cada refrigerio. Cmo  me afecta el alcohol? El alcohol puede provocar disminuciones sbitas de la glucemia (hipoglucemia), especialmente si utiliza insulina o toma determinados medicamentos por va oral para la diabetes. La hipoglucemia es una afeccin potencialmente mortal. Los sntomas de la hipoglucemia, como somnolencia, mareos y confusin, son similares a los sntomas de haber consumido demasiado alcohol. No beba alcohol si: Su mdico le indica no hacerlo. Est embarazada, puede estar embarazada o est tratando de quedar embarazada. Si bebe alcohol: No beba con el estmago vaco. Limite la cantidad que bebe: De 0 a 1 medida por da para las mujeres. De 0 a 2 medidas por da para los hombres. Est atento a la cantidad de alcohol que hay en las bebidas que toma. En los [place], una medida equivale a una botella de cerveza de 12 oz (355 ml), un vaso de vino de 5 oz (148 ml) o un vaso de una bebida alcohlica de alta graduacin de 1 oz (44 ml). Mantngase hidratado bebiendo agua, refrescos dietticos o t helado sin azcar. Tenga en cuenta que los refrescos comunes, los jugos y otras bebida para mezclar pueden contener mucha azcar y se deben contar como carbohidratos. Consejos para seguir este plan Leer las etiquetas de los alimentos Comience por leer el tamao de la porcin en la "Informacin nutricional" en las etiquetas de los alimentos envasados y las bebidas. La cantidad de caloras, carbohidratos, grasas y otros nutrientes mencionados en la etiqueta se basan en una porcin del alimento. Muchos alimentos contienen ms de una porcin por envase. Verifique la cantidad total de gramos (g) de carbohidratos totales en una porcin. Puede calcular la cantidad de porciones de carbohidratos al dividir el total de carbohidratos por 15. Por ejemplo, si un alimento tiene un   total de 30 g de carbohidratos totales por porcin, equivale a 2 porciones de carbohidratos. Verifique la cantidad de gramos (g) de grasas  saturadas y grasas trans de una porcin. Escoja alimentos que no contengan estas grasas o que su contenido de estas sea bajo. Verifique la cantidad de miligramos (mg) de sal (sodio) en una porcin. La mayora de las personas deben limitar la ingesta de sodio total a menos de 2300 mg por da. Siempre consulte la informacin nutricional de los alimentos etiquetados como "con bajo contenido de grasa" o "sin grasa". Estos alimentos pueden tener un mayor contenido de azcar agregada o carbohidratos refinados, y deben evitarse. Hable con su nutricionista para identificar sus objetivos diarios en cuanto a los nutrientes mencionados en la etiqueta. Al ir de compras Evite comprar alimentos procesados, enlatados o precocidos. Estos alimentos tienden a tener una mayor cantidad de grasa, sodio y azcar agregada. Compre en la zona exterior de la tienda de comestibles. Esta es la zona donde se encuentran con mayor frecuencia las frutas y las verduras frescas, los cereales a granel, las carnes frescas y los productos lcteos frescos. Al cocinar Utilice mtodos de coccin a baja temperatura, como hornear, en lugar de mtodos de coccin a alta temperatura, como frer en abundante aceite. Cocine con aceites saludables, como el aceite de oliva, canola o girasol. Evite cocinar con manteca, crema o carnes con alto contenido de grasa. Planificacin de las comidas Coma las comidas y los refrigerios regularmente, preferentemente a la misma hora todos los das. Evite pasar largos perodos de tiempo sin comer. Consuma alimentos ricos en fibra, como frutas frescas, verduras, frijoles y cereales integrales. Consulte a su nutricionista sobre cuntas porciones de carbohidratos puede consumir en cada comida. Consuma entre 4 y 6 onzas (entre 112 y 168 g) de protenas magras por da, como carnes magras, pollo, pescado, huevos o tofu. Una onza (oz) de protena magra equivale a: 1 onza (28 g) de carne, pollo o pescado. 1 huevo.  de  taza (62 g) de tofu. Coma algunos alimentos por da que contengan grasas saludables, como aguacates, frutos secos, semillas y pescado. Qu alimentos debo comer? Frutas Bayas. Manzanas. Naranjas. Duraznos. Damascos. Ciruelas. Uvas. Mango. Papaya. Granada. Kiwi. Cerezas. Verduras Lechuga. Espinaca. Verduras de hoja verde, que incluyen col rizada, acelga, hojas de berza y de mostaza. Remolachas. Coliflor. Repollo. Brcoli. Zanahorias. Judas verdes. Tomates. Pimientos. Cebollas. Pepinos. Coles de Bruselas. Granos Granos integrales, como panes, galletas, tortillas, cereales y pastas de salvado o integrales. Avena sin azcar. Quinua. Arroz integral o salvaje. Carnes y otras protenas Mariscos. Carne de ave sin piel. Cortes magros de ave y carne de res. Tofu. Frutos secos. Semillas. Lcteos Productos lcteos sin grasa o con bajo contenido de grasa, como leche, yogur y queso. Es posible que los productos que se enumeran ms arriba no constituyan una lista completa de los alimentos y las bebidas que puede tomar. Consulte a un nutricionista para obtener ms informacin. Qu alimentos debo evitar? Frutas Frutas enlatadas al almbar. Verduras Verduras enlatadas. Verduras congeladas con mantequilla o salsa de crema. Granos Productos elaborados con harina y harina blanca refinada, como panes, pastas, bocadillos y cereales. Evite todos los alimentos procesados. Carnes y otras protenas Cortes de carne con alto contenido de grasa. Carne de ave con piel. Carnes empanizadas o fritas. Carne procesada. Evite las grasas saturadas. Lcteos Yogur, queso o leche enteros. Bebidas Bebidas azucaradas, como gaseosas o t helado. Es posible que los productos que se enumeran ms arriba no constituyan una lista completa de   los alimentos y las bebidas que debe evitar. Consulte a un nutricionista para obtener ms informacin. Preguntas para hacerle al mdico Es necesario que me rena con un instructor en el cuidado  de la diabetes? Es necesario que me rena con un nutricionista? A qu nmero puedo llamar si tengo preguntas? Cules son los mejores momentos para controlar la glucemia? Dnde encontrar ms informacin: Asociacin Estadounidense de la Diabetes (American Diabetes Association): diabetes.org Academy of Nutrition and Dietetics (Academia de Nutricin y Diettica): www.eatright.org National Institute of Diabetes and Digestive and Kidney Diseases (Instituto Nacional de la Diabetes y las Enfermedades Digestivas y Renales): www.niddk.nih.gov Association of Diabetes Care and Education Specialists (Asociacin de Especialistas en Atencin y Educacin sobre la Diabetes): www.diabeteseducator.org Resumen Es importante tener hbitos alimenticios saludables debido a que sus niveles de azcar en la sangre (glucosa) se ven afectados en gran medida por lo que come y bebe. Un plan de alimentacin saludable lo ayudar a controlar la glucemia y mantener un estilo de vida saludable. El mdico puede recomendarle que trabaje con un nutricionista para elaborar el mejor plan para usted. Tenga en cuenta que los carbohidratos (hidratos de carbono) y el alcohol tienen efectos inmediatos en sus niveles de glucemia. Es importante contar los carbohidratos que ingiere y consumir alcohol con prudencia. Esta informacin no tiene como fin reemplazar el consejo del mdico. Asegrese de hacerle al mdico cualquier pregunta que tenga. Document Revised: 09/18/2019 Document Reviewed: 09/18/2019 Elsevier Patient Education  2021 [employer].  

## 2021-02-16 ENCOUNTER — Telehealth: Payer: Self-pay | Admitting: *Deleted

## 2021-02-16 NOTE — Chronic Care Management (AMB) (Signed)
  Chronic Care Management   Note  02/16/2021 Name: [name] MRN: [id number] DOB: 03/28/1951  Patrick Park is a 70 y.o. year old male who is a primary care patient of [name], Ines Bloomer, MD. I reached out to Barbera Setters by phone today in response to a referral sent by [name] PCP , Ines Bloomer, MD     Patrick Park was given information about Chronic Care Management services today including:  CCM service includes personalized support from designated clinical staff supervised by his physician, including individualized plan of care and coordination with other care providers 24/7 contact phone numbers for assistance for urgent and routine care needs. Service will only be billed when office clinical staff spend 20 minutes or more in a month to coordinate care. Only one practitioner may furnish and bill the service in a calendar month. The patient may stop CCM services at any time (effective at the end of the month) by phone call to the office staff. The patient will be responsible for cost sharing (co-pay) of up to 20% of the service fee (after annual deductible is met).  Patient agreed to services and verbal consent obtained.  Via interpreter Janett Billow   Follow up plan: Telephone appointment with care management team member scheduled for: 02/23/21  Patrick Park, [facility]  Direct Dial: [phone]

## 2021-02-23 ENCOUNTER — Ambulatory Visit (INDEPENDENT_AMBULATORY_CARE_PROVIDER_SITE_OTHER): Payer: Medicare Other | Admitting: *Deleted

## 2021-02-23 DIAGNOSIS — E119 Type 2 diabetes mellitus without complications: Secondary | ICD-10-CM

## 2021-02-23 DIAGNOSIS — I152 Hypertension secondary to endocrine disorders: Secondary | ICD-10-CM

## 2021-02-23 DIAGNOSIS — E1159 Type 2 diabetes mellitus with other circulatory complications: Secondary | ICD-10-CM | POA: Diagnosis not present

## 2021-02-23 NOTE — Patient Instructions (Signed)
Informacin de la visita  [place], fue agradable hablar contigo hoy.  Lea la informacin adjunta y comience ahora a Field seismologist cambios en su dieta para disminuir los carbohidratos, los azcares y la sal en su dieta.  Tenga listos sus medicamentos, su nivel de azcar en la sangre y su presin arterial en casa cuando lo llame la prxima vez; los revisaremos durante nuestra llamada telefnica.  Espero volver a hablar con usted para obtener una actualizacin el jueves 28 de julio de 2022 a las 9:30 a. m. Est atento a mi Associate Professor. Llamar lo ms cerca posible de las 9:30 am; Espero escuchar acerca de su progreso.  No dude en comunicarse conmigo si puedo ayudarlo antes de nuestra prxima cita programada.  Oneta Rack, RN, BSN, [facility] RN Care Coordination- [place] [phone]: [place] [phone]: mvil  METAS DEL PACIENTE: Janina Mayo abordados  Progreso de esta visita  Controle y administre mi nivel de azcar en la sangre: diabetes tipo 2 por buen camino Plazo: objetivo a Production designer, theatre/television/film plazo Prioridad: Media Fecha de inicio: 29/06/22 Toma Copier de finalizacin prevista: 29/06/23  Toma Copier de seguimiento 28/02/2021    Mida el nivel de azcar en la sangre a las horas prescritas: a primera hora de la maana antes de comer y luego nuevamente 2 horas despus de una comida  Anote sus niveles de azcar en la sangre en casa; los revisaremos durante cada una de nuestras llamadas telefnicas de seguimiento.  Mida el nivel de azcar en la sangre si siento que est demasiado alto o demasiado bajo, o si se siente mal y no est seguro de por qu se siente mal.  Asegrese de tomar sus medicamentos tal como se los receten; la prxima vez que lo llame, revisaremos los medicamentos que toma  Lleve los niveles de azcar en la sangre que anota a todas sus citas con el mdico  Sigan con el gran trabajo controlando su presin arterial en casa: escrbalas tambin en  papel; los revisaremos durante nuestras llamadas telefnicas de seguimiento   Porque es esto importante? ? Revisar su nivel de azcar en la sangre en casa ayuda a evitar que suba o baje demasiado. ? Yahoo en un diario o registro ayuda al mdico a saber cmo cuidarlo. ? Su registro de Dispensing optician debe tener la hora, la fecha y [place]. ? Adems, anote la cantidad de insulina u otro medicamento que toma. ? Otra informacin, como lo que comi, el ejercicio realizado y cmo se senta, tambin ser til.  Diabetes mellitus y nutricin, en adultos Diabetes Mellitus and Nutrition, Adult Si sufre de diabetes, o diabetes mellitus, es muy importante tener hbitos alimenticios saludables debido a que sus niveles de Designer, television/film set sangre (glucosa) se ven afectados en gran medida por lo que come y bebe. Comer alimentos saludables en las cantidades correctas, aproximadamente a la misma hora todos los [place], [place] ayudar a: Aeronautical engineer glucemia. Disminuir el riesgo de sufrir una enfermedad cardaca. Mejorar la presin arterial. Science writer o mantener un peso saludable. Qu puede afectar mi plan de alimentacin? Todas las personas que sufren de diabetes son diferentes y cada una tiene necesidades diferentes en cuanto a un plan de alimentacin. El mdico puede recomendarle que trabaje con un nutricionista para elaborar el mejor plan para usted. Su plan de alimentacin puede variar segn factores como: Las caloras que necesita. Los medicamentos que toma. Su peso. Sus niveles de glucemia, presin arterial y colesterol.  Su nivel de [place]. Otras afecciones que tenga, como enfermedades cardacas o renales. Cmo me afectan los carbohidratos? Los carbohidratos, o hidratos de carbono, afectan su nivel de glucemia ms que cualquier otro tipo de alimento. La ingesta de carbohidratos naturalmente aumenta la cantidad de [employer]. El recuento de carbohidratos es un mtodo  destinado a Catering manager un registro de la cantidad de carbohidratos que se consumen. El recuento de carbohidratos es importante para Theatre manager la glucemia a un nivel saludable, especialmente si utiliza insulina o toma determinadosmedicamentos por va oral para la diabetes. Es importante conocer la cantidad de carbohidratos que se pueden ingerir en cada comida sin correr Engineer, manufacturing. Esto es Psychologist, forensic. Su nutricionista puede ayudarlo a calcular la cantidad de carbohidratos que debeingerir en cada comida y en cada refrigerio. Cmo me afecta el alcohol? El alcohol puede provocar disminuciones sbitas de la glucemia (hipoglucemia), especialmente si utiliza insulina o toma determinados medicamentos por va oral para la diabetes. La hipoglucemia es una afeccin potencialmente mortal. Los sntomas de la hipoglucemia, como somnolencia, mareos y confusin, son similares a los sntomas de haber consumido demasiado alcohol. No beba alcohol si: Su mdico le indica no hacerlo. Est embarazada, puede estar embarazada o est tratando de [place]. Si bebe alcohol: No beba con el estmago vaco. Limite la cantidad que bebe: De 0 a 1 medida por da para las mujeres. De 0 a 2 medidas por da para los hombres. Est atento a la cantidad de alcohol que hay en las bebidas que toma. En los [place], una medida equivale a una botella de cerveza de 12 oz (355 ml), un vaso de vino de 5 oz (148 ml) o un vaso de una bebida alcohlica de alta graduacin de 1 oz (44 ml). Mantngase hidratado bebiendo agua, refrescos dietticos o t helado sin azcar. Tenga en cuenta que los refrescos comunes, los jugos y otras bebida para Optician, dispensing pueden contener mucha azcar y se deben contar como carbohidratos. Consejos para seguir Company secretary las etiquetas de los alimentos Comience por leer el tamao de la porcin en la "Informacin nutricional" en las etiquetas de los alimentos envasados y las bebidas. La  cantidad de caloras, carbohidratos, grasas y otros nutrientes mencionados en la etiqueta se basan en una porcin del alimento. Muchos alimentos contienen ms de una porcin por envase. Verifique la cantidad total de gramos (g) de carbohidratos totales en una porcin. Puede calcular la cantidad de porciones de carbohidratos al dividir el total de carbohidratos por 15. Por ejemplo, si un alimento tiene un total de 30 g de carbohidratos totales por porcin, equivale a 2 porciones de carbohidratos. Verifique la cantidad de gramos (g) de grasas saturadas y grasas trans de una porcin. Escoja alimentos que no contengan estas grasas o que su contenido de estas sea [place]. Verifique la cantidad de miligramos (mg) de sal (sodio) en una porcin. La [employer] de las personas deben limitar la ingesta de sodio total a menos de 2300 mg Honeywell. Siempre consulte la informacin nutricional de los alimentos etiquetados como "con bajo contenido de grasa" o "sin grasa". Estos alimentos pueden tener un mayor contenido de Location manager agregada o carbohidratos refinados, y deben evitarse. Hable con su nutricionista para identificar sus objetivos diarios en cuanto a los nutrientes mencionados en la etiqueta. Al ir de compras Evite comprar alimentos procesados, enlatados o precocidos. Estos alimentos tienden a Special educational needs teacher mayor cantidad de [place], sodio y azcar agregada. Compre en la zona exterior de  la tienda de comestibles. Esta es la zona donde se encuentran con mayor frecuencia las frutas y las verduras frescas, los cereales a granel, las carnes frescas y los productos lcteos frescos. Al cocinar Utilice mtodos de coccin a baja temperatura, como hornear, en lugar de mtodos de coccin a alta temperatura, como frer en abundante aceite. Cocine con aceites saludables, como el aceite de [place], canola o [place]. Evite cocinar con manteca, crema o carnes con alto contenido de grasa. Planificacin de las comidas Coma las comidas y los  refrigerios regularmente, preferentemente a la misma hora todos [place]. Evite pasar largos perodos de tiempo sin comer. Consuma alimentos ricos en fibra, como frutas frescas, verduras, frijoles y cereales integrales. Consulte a su nutricionista sobre cuntas porciones de carbohidratos puede consumir en cada comida. Consuma entre 4 y 6 onzas (entre 112 y 168 g) de protenas magras por da, como carnes [place], pollo, pescado, huevos o tofu. Una onza (oz) de protena magra equivale a: 1 onza (28 g) de carne, pollo o pescado. 1 huevo.  de taza (62 g) de tofu. Coma algunos alimentos por da que contengan grasas saludables, como aguacates, frutos secos, semillas y pescado. Qu alimentos debo comer? Lambert Mody Bayas. Manzanas. Naranjas. Duraznos. Damascos. Ciruelas. Uvas. Mango. Papaya.[place]. Kiwi. Cerezas. [place] Valeda Malm. [place]. Verduras de Boeing, que incluyen col rizada, [place], hojas de [place] y de [place]. Remolachas. Coliflor. Repollo. Brcoli. Zanahorias. Judas verdes. Tomates. Pimientos. Cebollas. Pepinos. Coles deBruselas. Granos Granos integrales, como panes, galletas, tortillas, cereales y pastas desalvado o integrales. Avena sin azcar. Quinua. Arroz integral o salvaje. Carnes y Psychiatric nurse. Carne de ave sin piel. Cortes magros de ave y carne de res. Tofu.Frutos secos. Semillas. Lcteos Productos lcteos sin grasa o con bajo contenido de [place], [place], yogur [place]. Es posible que los productos que se enumeran ms [place] no constituyan una lista completa de los alimentos y las bebidas que puede tomar. Consulte a un nutricionista para obtener ms informacin. Qu alimentos debo evitar? Lambert Mody Frutas enlatadas al almbar. Verduras Verduras enlatadas. Verduras congeladas con mantequilla o salsa de crema. Granos Productos elaborados con [place] y [place], como panes, pastas,bocadillos y cereales. Evite todos los alimentos procesados. Carnes y  otras protenas Cortes de carne con alto contenido de Lobbyist. Carne de ave con piel. Carnesempanizadas o fritas. Carne procesada. Evite las grasas saturadas. Lcteos [place]og[place], [place] enteros. Bebidas Bebidas azucaradas, como gaseosas o t helado. Es posible que los productos que se enumeran ms [place] no constituyan una lista completa de los alimentos y las bebidas que Nurse, adult. Consulte a un nutricionista para obtener ms informacin. Preguntas para hacerle al mdico Es necesario que me rena con Radio broadcast assistant en el cuidado de la diabetes? Es necesario que me rena con un nutricionista? A qu nmero puedo llamar si tengo preguntas? Cules son los mejores momentos para controlar la glucemia? Dnde encontrar ms informacin: Asociacin Estadounidense de la Diabetes (American Diabetes Association): diabetes.org Academy of Nutrition and Dietetics (Academia de Nutricin y Information systems manager): www.eatright.Unisys Corporation of Diabetes and Digestive and Kidney Diseases ([place] la Diabetes y [place] y [place]): DesMoinesFuneral.dk Association of Diabetes Care and Education Specialists (Asociacin de Especialistas en Atencin y Educacin sobre la Diabetes): www.diabeteseducator.org Resumen Es importante tener hbitos alimenticios saludables debido a que sus niveles de Designer, television/film set sangre (glucosa) se ven afectados en gran medida por lo que come y bebe. Un plan de alimentacin saludable lo ayudar a controlar la glucemia y Cisco  un estilo de vida saludable. El mdico puede recomendarle que trabaje con un nutricionista para elaborar el mejor plan para usted. Tenga en cuenta que los carbohidratos (hidratos de carbono) y el alcohol tienen efectos inmediatos en sus niveles de glucemia. Es importante contar los carbohidratos que ingiere y consumir alcohol con prudencia. Esta informacin no tiene Marine scientist el consejo del mdico. Asegresede hacerle al  mdico cualquier pregunta que tenga. Document Revised: 09/18/2019 Document Reviewed: 09/18/2019 Elsevier Patient Education  2021 [place] del MCP: El Sr. Glvez recibi informacin sobre los servicios de administracin de atencin crnica el 27 de junio de 2022, que incluye: 1. El servicio de CCM incluye apoyo personalizado del personal clnico designado supervisado por su mdico, incluido un plan de atencin individualizado y coordinacin con otros proveedores de atencin 2. Nmeros de telfono de [employer] Systems las 24 horas del da, los 7 das de la [place], para asistencia con necesidades de atencin urgente y de [place]. 3. El servicio solo se facturar cuando el personal clnico del consultorio dedique 20 minutos o ms en un mes a coordinar la atencin. 4. Un solo mdico podr prestar y Designer, television/film set en un mes natural. 5. El paciente puede suspender los servicios de CCM en cualquier momento (efectivo al final del mes) mediante una llamada telefnica al personal de la oficina. 6. El paciente ser responsable del costo compartido (copago) de hasta el 20 % de la tarifa del servicio (despus de alcanzar el deducible anual).  El paciente acept los servicios y Animal nutritionist consentimiento verbal.   El paciente verbaliz la comprensin de las instrucciones, los materiales educativos y Photographer plan de atencin proporcionados hoy y acept recibir por correo una copia de las instrucciones para el [place], los materiales educativos y Theatre manager de atencin.  Cita telefnica de seguimiento con un miembro del equipo de administracin de la atencin programada para:  Se le ha proporcionado al paciente la informacin de contacto del equipo de administracin de la atencin y se le ha aconsejado que llame si tiene preguntas o inquietudes relacionadas con la salud.  Oneta Rack, RN, BSN, [place] [phone]: direct  office [phone]: mobile  PLAN DE ATENCIN CLNICA: Plan de Atencin al Paciente: Diabetes Tipo 2 (Adulto)   Problema identificado: Manejo de la glucemia (Diabetes, tipo 2) Prioridad: Media   Objetivo a largo plazo: gestin glucmica optimizada Fecha de inicio: 29/01/2021 Fecha de finalizacin prevista: 29/01/2022 Progreso de esta visita: en camino Prioridad: Media Nota: Objetivo: Resultados de laboratorio Fecha de valor del componente HGBA1C 7.8 (A) 21/01/2021  Resultados de laboratorio Fecha de valor del componente CREATININA 1.02 21/01/2021 CREATININA 0.97 08/30/2020 CREATININA 0.75 (L) 30/06/2020  Resultados de laboratorio Fecha de valor del componente EGFR 85 10/26/2020  Barreras Actuales:  Dficits de conocimiento relacionados con la fisiopatologa bsica de la diabetes y el autocuidado/manejo: requerirn un refuerzo continuo de la misma.  Paciente de habla hispana: requiere servicios de interpretacin  No se adhiere consistentemente a las recomendaciones del proveedor sobre: dieta baja en carbohidratos/baja en azcar/baja en sal Meta(s) clnica(s) del administrador de casos: Durante los prximos 12 meses, el paciente demostrar una adherencia continua al plan de tratamiento prescrito para el autocuidado/control de la diabetes, como lo demuestra:  Monitoreo y Control and instrumentation engineer diario de CBG dos veces al da: en ayunas y 2 horas despus de las comidas  Cumplimiento/mejora del seguimiento de la dieta ADA/modificada en carbohidratos  Cumplimiento del rgimen  de medicacin prescrito  Comunicarse con los proveedores de atencin en caso de sntomas, inquietudes o preguntas nuevos o que hayan empeorado Intervenciones:  Colaboracin con [name], Ines Bloomer, MD en relacin con el desarrollo y la actualizacin del plan integral de atencin como lo demuestra la certificacin y la firma conjunta del proveedor  Colaboracin del equipo de atencin interdisciplinario (consulte el plan  de atencin longitudinal)  Revisin del estado del paciente, incluida la revisin de los informes de los consultores, [place] de laboratorio y Research scientist (physical sciences), y medicamentos completados  Revisado con la visita reciente al consultorio del PCP del paciente el 21/06/22: confirm que obtuvo recetas para Farxiga y aument la dosis de metformina, segn las instrucciones; English as a second language teacher revisin de medicamentos hoy, afirma que no est cerca de sus medicamentos  Confirmado que no tiene problemas con los medicamentos actuales: Technical brewer los medicamentos de forma independiente, no [place] pastilleros en casa  Discuti la prctica actual del paciente en el hogar: monitorea los niveles de azcar en la sangre en ayunas y QHS; el paciente informa que desde su ltima visita al consultorio del PCP el 21/06/22, "ya ha visto una mejora" en sus niveles de azcar en la sangre; Risk manager la revisin de los valores especficos de azcar en la sangre hoy; afirma que no est cerca de sus niveles de azcar en la sangre escritos en casa; confirma que anota los niveles de azcar en la sangre e informa que el nivel de azcar en la sangre en ayunas esta maana es "24"  El paciente confirmado monitorea/registra la presin arterial en el hogar, "aproximadamente todos los das"; informa presin arterial reciente de "132/86"; anim al paciente a anotar estos valores para una revisin posterior: explic que la DM y la HTA deben controlarse de Risk manager, ya que se afectan mutuamente; el paciente confirmado est tomando los medicamentos recetados para HTN, HLD  Brind educacin verbal al paciente sobre el proceso bsico de la enfermedad de DM, cmo afecta la salud general, los riesgos de complicaciones por niveles altos de Location manager en la sangre con el tiempo; el paciente requerir un refuerzo continuo de lo mismo, con el [place].  Educacin verbal iniciada sobre el manejo diettico de la DM/HTA; informes de pacientes que siguen una dieta  regular; discuti la importancia de [employer] carbohidratos/azcares y la sal en el manejo de DM y HTN/HLD; anim al paciente a comenzar a tratar de comer menos arroz blanco y a Marketing executive integral, si es posible; est dispuesto a intentarlo: requerir un refuerzo continuo de las Furniture conservator/restorer en el hogar para Psychologist, educational DM/ HTN/ HLD  Citas de proveedores programadas/prximas revisadas: no hay citas programadas prximas  Discuti los planes con el paciente para el seguimiento continuo de la gestin de la atencin y proporcion al paciente informacin de contacto directo para el equipo de gestin de Scientist, physiological. Actividades de autocuidado  Autoadministra medicamentos orales segn lo prescrito  Asiste a todas las citas programadas con el proveedor  Careers adviser de Dispensing optician y la presin arterial en casa todos los [place] del paciente:  Mida el nivel de azcar en la sangre a las horas prescritas: a primera hora de la maana antes de comer y luego nuevamente 2 horas despus de una comida  Anote sus niveles de azcar en la sangre en casa; los revisaremos durante cada una de nuestras llamadas telefnicas de seguimiento.  Mida el nivel de azcar en la sangre si siento que est demasiado  alto o demasiado bajo, o si se siente mal y no est seguro de por qu se siente mal.  Asegrese de tomar sus medicamentos tal como se los receten; la prxima vez que lo llame, revisaremos los medicamentos que toma  Lleve los niveles de azcar en la sangre que anota a todas sus citas con el mdico  Sigan con el gran trabajo controlando su presin arterial en casa: escrbalas tambin en papel; los revisaremos durante nuestras llamadas telefnicas de seguimiento Plan de Seguimiento:  Cita telefnica de seguimiento con un miembro del equipo de administracin de la atencin programada para: jueves 28 de julio de 2022 a las 9:30 am  Se le ha proporcionado al paciente la informacin de  Diplomatic Services operational officer del equipo de administracin de la atencin y se le ha aconsejado que llame si tiene preguntas o inquietudes relacionadas con la salud.   Oneta Rack, RN, BSN, [facility] RN Care Coordination- [place] [phone]: direct office [phone]: mobile

## 2021-02-23 NOTE — Chronic Care Management (AMB) (Signed)
Chronic Care Management   CCM RN Visit Note  02/23/2021 Name: Patrick Park MRN: [id number] DOB: [birth year]  Subjective: Patrick Park is a 70 y.o. year old male who is a primary care patient of [name], Ines Bloomer, MD. The care management team was consulted for assistance with disease management and care coordination needs.    Engaged with patient by telephone for initial visit in response to provider referral for case management and/or care coordination services.  [employer] for entirety of call: Hassan Rowan ID# [id number]  Consent to Services:  The patient was given information about Chronic Care Management services, agreed to services, and gave verbal consent 02/16/21 prior to initiation of services.  Please see initial visit note for detailed documentation.  Patient agreed to services and verbal consent obtained.   Assessment: Review of patient past medical history, allergies, medications, health status, including review of consultants reports, laboratory and other test data, was performed as part of comprehensive evaluation and provision of chronic care management services.   SDOH (Social Determinants of Health) assessments and interventions performed:  SDOH Interventions    Flowsheet Row Most Recent Value  SDOH Interventions   Food Insecurity Interventions Intervention Not Indicated  Financial Strain Interventions Intervention Not Indicated  Housing Interventions Intervention Not Indicated  Transportation Interventions Intervention Not Indicated      CCM Care Plan  No Known Allergies  Outpatient Encounter Medications as of 02/23/2021  Medication Sig   dapagliflozin propanediol (FARXIGA) 10 MG TABS tablet Take 10 mg by mouth daily.   metFORMIN (GLUCOPHAGE) 1000 MG tablet Take 1 tablet (1,000 mg total) by mouth 2 (two) times daily with a meal.   aspirin EC 81 MG tablet Take 81 mg by mouth daily.   atorvastatin (LIPITOR) 40 MG tablet Take 1 tablet (40 mg  total) by mouth daily.   Azelastine-Fluticasone (DYMISTA) 137-50 MCG/ACT SUSP Spray once in each nostril twice a day   Fish Oil OIL Take 1 capsule by mouth daily.   glucose blood test strip Use to check FSBS daily. Dx: E11.9   Lancets MISC OneTouch Delica Lancets 33 gauge  check glucose levels 2-3 times a day DX: E11.65, NPI [id number], Date: 03/21/17   losartan-hydrochlorothiazide (HYZAAR) 50-12.5 MG tablet Take 1 tablet by mouth daily.   montelukast (SINGULAIR) 10 MG tablet Take 1 tablet (10 mg total) by mouth at bedtime.   Multiple Vitamin (MULTIVITAMIN) tablet Take 1 tablet by mouth daily.   omeprazole (PRILOSEC) 20 MG capsule Take 1 capsule (20 mg total) by mouth daily.   triamcinolone (KENALOG) 0.1 % Apply 1 application topically 2 (two) times daily.   No facility-administered encounter medications on file as of 02/23/2021.    Patient Active Problem List   Diagnosis Date Noted   Class 1 obesity due to excess calories with serious comorbidity and body mass index (BMI) of 32.0 to 32.9 in adult 09/23/2019   Allergic rhinitis 03/28/2019   Hypertension associated with diabetes ([place]) 10/03/2018   Dyslipidemia associated with type 2 diabetes mellitus ([place]) 10/03/2018   Dyslipidemia 10/03/2018   General weakness 10/03/2018   ED (erectile dysfunction) 01/22/2013   GERD (gastroesophageal reflux disease) 10/24/2012   Conditions to be addressed/monitored:  HTN, HLD, and DMII  Care Plan : Diabetes Type 2 (Adult)  Updates made by Knox Royalty, RN since 02/23/2021 12:00 AM     Problem: Glycemic Management (Diabetes, Type 2)   Priority: Medium     Long-Range Goal: Glycemic Management Optimized   Start Date: 02/23/2021  Expected End Date: 02/23/2022  This Visit's Progress: On track  Priority: Medium  Note:   Objective:  Lab Results  Component Value Date   HGBA1C 7.8 (A) 02/15/2021   Lab Results  Component Value Date   CREATININE 1.02 02/15/2021   CREATININE 0.97 10/26/2020    CREATININE 0.75 (L) 07/27/2020   Lab Results  Component Value Date   EGFR 85 10/26/2020   Current Barriers:  Knowledge Deficits related to basic Diabetes pathophysiology and self care/management- will require ongoing reinforcement of same Spanish speaking patient- requires interpreter services Does not consistently adhere to provider recommendations re: low carb/ low sugar/ low salt diet Case Manager Clinical Goal(s):  Over the next 12 months, patient will demonstrate ongoing adherence to prescribed treatment plan for diabetes self care/management as evidenced by:  Daily monitoring and recording of CBG twice daily: fasting and 2-hours post-prandial  Adherence to/ improvement of following ADA/ carb modified diet  Adherence to prescribed medication regimen  Contacting care providers for new or worsened symptoms, concerns or questions Interventions:  Collaboration with Horald Pollen, MD regarding development and update of comprehensive plan of care as evidenced by provider attestation and co-signature Inter-disciplinary care team collaboration (see longitudinal plan of care) Review of patient status, including review of consultants reports, relevant laboratory and other test results, and medications completed Reviewed with patient recent PCP office visit 02/15/21: confirmed that he obtained prescriptions for Farxiga and has increased metformin dose, as instructed; he declines medication review today, states he is not near his medications Confirmed no current medication concerns: manages medications independently, does not use pill box at home Discussed patient's current practice at home: monitors blood sugars fasting and qHS; patient reports that since his last PCP office visit 02/15/21, he has "already seen improvement" in his blood sugars; declines review of specific blood sugar values today- states is not near his written blood sugars from home- confirms that he writes down blood sugars  and reports fasting blood sugar this morning of "120" Confirmed patient monitors/ records blood pressures at home, "about every day;" reports recent blood pressure of "132/86;" encouraged patient to write these values down for further review: explained that DM and HTN should be closely monitored at home, as they affect one another; confirmed patient is taking medications as prescribed for HTN, HLD  Provided verbal education to patient about basic DM disease process, how it affects overall health, risks of complications from high blood sugars over time- patient will require ongoing reinforcement of same, over time Initiated verbal education around dietary management of DM/ HTN; patient reports following regular diet; discussed importance of limiting carbs/ sugars and salt in management of DM and HTN/ HLD; encouraged patient to begin trying to eat less white rice and to chose whole grain rice, if possible- he is willing to try: will require ongoing reinforcement of dietary strategies at home to self- manage DM/ HTN/ HLD Reviewed scheduled/upcoming provider appointments- no upcoming scheduled appointments Discussed plans with patient for ongoing care management follow up and provided patient with direct contact information for care management team Self-Care Activities Self administers oral medications as prescribed Attends all scheduled provider appointments Checks blood sugars and blood pressures at home daily  Patient Goals: Check blood sugar at prescribed times: first thing in the morning before eating and then again 2 hours after a meal Write down your blood sugars at home- we will review these during each of our follow up phone calls Check blood sugar if I feel it is  too high or too low, or if you feel bad and are not sure why you feel bad Be sure to take your medications as they are prescribed; when I call you next, we will review the medications that you take Take the blood sugars you write down  to all of your doctor appointments  Keep up the great work checking your blood pressures at home: please write these down on paper as well; we will review these during our follow up phone calls Follow Up Plan:  Telephone follow up appointment with care management team member scheduled for: Thursday, March 24, 2021 at 9:30 am The patient has been provided with contact information for the care management team and has been advised to call with any health related questions or concerns.      Plan: Telephone follow up appointment with care management team member scheduled for:  Thursday, March 24, 2021 at 9:30 am The patient has been provided with contact information for the care management team and has been advised to call with any health related questions or concerns.   Oneta Rack, RN, BSN, [facility] RN Care Coordination- [place] [phone]: direct office [phone]: mobile

## 2021-03-24 ENCOUNTER — Ambulatory Visit (INDEPENDENT_AMBULATORY_CARE_PROVIDER_SITE_OTHER): Payer: Medicare Other | Admitting: *Deleted

## 2021-03-24 DIAGNOSIS — E785 Hyperlipidemia, unspecified: Secondary | ICD-10-CM

## 2021-03-24 DIAGNOSIS — E119 Type 2 diabetes mellitus without complications: Secondary | ICD-10-CM | POA: Diagnosis not present

## 2021-03-24 DIAGNOSIS — E1159 Type 2 diabetes mellitus with other circulatory complications: Secondary | ICD-10-CM | POA: Diagnosis not present

## 2021-03-24 DIAGNOSIS — I152 Hypertension secondary to endocrine disorders: Secondary | ICD-10-CM

## 2021-03-24 DIAGNOSIS — E1169 Type 2 diabetes mellitus with other specified complication: Secondary | ICD-10-CM

## 2021-03-24 NOTE — Chronic Care Management (AMB) (Signed)
Chronic Care Management   CCM RN Visit Note  03/24/2021 Name: Patrick Park MRN: [id number] DOB: 26-Aug-1951  Subjective: Patrick Park is a 70 y.o. year old male who is a primary care patient of [name], Ines Bloomer, MD. The care management team was consulted for assistance with disease management and care coordination needs.    Engaged with patient by telephone for follow up visit in response to provider referral for case management and/or care coordination services. [employer] utilized: call completed with interpreter "[place]," [place] [phone]  Consent to Services:  The patient was given information about Chronic Care Management services, agreed to services, and gave verbal consent prior to initiation of services.  Please see initial visit note for detailed documentation.  Patient agreed to services and verbal consent obtained.   Assessment: Review of patient past medical history, allergies, medications, health status, including review of consultants reports, laboratory and other test data, was performed as part of comprehensive evaluation and provision of chronic care management services.   CCM Care Plan  No Known Allergies  Outpatient Encounter Medications as of 03/24/2021  Medication Sig   aspirin EC 81 MG tablet Take 81 mg by mouth daily.   atorvastatin (LIPITOR) 40 MG tablet Take 1 tablet (40 mg total) by mouth daily.   Azelastine-Fluticasone (DYMISTA) 137-50 MCG/ACT SUSP Spray once in each nostril twice a day   dapagliflozin propanediol (FARXIGA) 10 MG TABS tablet Take 10 mg by mouth daily.   Fish Oil OIL Take 1 capsule by mouth daily.   glucose blood test strip Use to check FSBS daily. Dx: E11.9   Lancets MISC OneTouch Delica Lancets 33 gauge  check glucose levels 2-3 times a day DX: E11.65, NPI [id number], Date: 03/21/17   losartan-hydrochlorothiazide (HYZAAR) 50-12.5 MG tablet Take 1 tablet by mouth daily.   metFORMIN (GLUCOPHAGE) 1000 MG tablet Take 1  tablet (1,000 mg total) by mouth 2 (two) times daily with a meal.   montelukast (SINGULAIR) 10 MG tablet Take 1 tablet (10 mg total) by mouth at bedtime.   Multiple Vitamin (MULTIVITAMIN) tablet Take 1 tablet by mouth daily.   omeprazole (PRILOSEC) 20 MG capsule Take 1 capsule (20 mg total) by mouth daily.   triamcinolone (KENALOG) 0.1 % Apply 1 application topically 2 (two) times daily.   No facility-administered encounter medications on file as of 03/24/2021.    Patient Active Problem List   Diagnosis Date Noted   Class 1 obesity due to excess calories with serious comorbidity and body mass index (BMI) of 32.0 to 32.9 in adult 09/23/2019   Allergic rhinitis 03/28/2019   Hypertension associated with diabetes ([place]) 10/03/2018   Dyslipidemia associated with type 2 diabetes mellitus ([place]) 10/03/2018   Dyslipidemia 10/03/2018   General weakness 10/03/2018   ED (erectile dysfunction) 01/22/2013   GERD (gastroesophageal reflux disease) 10/24/2012   Conditions to be addressed/monitored:  HTN and DMII  Care Plan : Diabetes Type 2 (Adult)  Updates made by Knox Royalty, RN since 03/24/2021 12:00 AM     Problem: Glycemic Management (Diabetes, Type 2)   Priority: Medium     Long-Range Goal: Glycemic Management Optimized in setting of HTN   Start Date: 02/23/2021  Expected End Date: 02/23/2022  This Visit's Progress: On track  Recent Progress: On track  Priority: Medium  Note:   Objective:  Lab Results  Component Value Date   HGBA1C 7.8 (A) 02/15/2021   Lab Results  Component Value Date   CREATININE 1.02 02/15/2021   CREATININE  0.97 10/26/2020   CREATININE 0.75 (L) 07/27/2020   Lab Results  Component Value Date   EGFR 85 10/26/2020   Current Barriers:  Knowledge Deficits related to basic Diabetes pathophysiology and self care/management- will require ongoing reinforcement of same Spanish speaking patient- requires interpreter services Does not consistently adhere to provider  recommendations re: low carb/ low sugar/ low salt diet Case Manager Clinical Goal(s):  02/23/21: Over the next 12 months, patient will demonstrate ongoing adherence to prescribed treatment plan for diabetes self care/management in setting of concurrent HTN as evidenced by patient reporting during CCM RN CM outreach of:  Daily monitoring and recording of CBG twice daily: fasting and 2-hours post-prandial  Adherence to/ improvement of following ADA/ carb modified diet  Adherence to prescribed medication regimen  Monitoring and recording blood pressures at home weekly Contacting care providers for new or worsened symptoms, concerns or questions Interventions:  Collaboration with Horald Pollen, MD regarding development and update of comprehensive plan of care as evidenced by provider attestation and co-signature Inter-disciplinary care team collaboration (see longitudinal plan of care) Review of patient status, including review of consultants reports, relevant laboratory and other test results, and medications completed Interpreter services utilized to complete medication review/ initial assessment: patient again refuses medication review; states that he has picked up some odd jobs and is currently at work and does not see the point in a medication review; he assures me that he is taking his medications as prescribed and denies medication concerns Discussed clinical condition with patient: denies clinical concerns; initial assessment completed with the exception of medication review, as patient has refused Reviewed with patient recent blood sugars at home: reports fasting blood sugar ranges between 108-119; post-prandial blood sugars between 115-135 Confirmed that patient has received and been reviewing previously provided education around self-health management of diabetes Discussed diet with patient: he reports he has stopped drinking all soda and has begin decreasing carbohydrates/ sugar in  diet: positive reinforcement provided and he was encouraged to continue making small changes to diet to adhere to DM appropriate diet Reviewed recently recorded blood pressures at home- reports consistent values between 130-145/ 80-85 Confirmed patient does not have Advanced Directives: discussed with patient; will send printed educational material along with planning packet; explained to patient that planning packet may not be in Spanish- he confirms that he has family available that are able to read/ understand English, and will help him review Reinforced previously provided verbal education to patient about basic DM disease process, how it affects overall health, risks of complications from high blood sugars over time- patient will require ongoing reinforcement of same, over time Reviewed scheduled/upcoming provider appointments- no upcoming scheduled appointments Discussed plans with patient for ongoing care management follow up and provided patient with direct contact information for care management team Self-Care Activities Self administers oral medications as prescribed Attends all scheduled provider appointments Checks blood sugars and blood pressures at home daily  Patient Goals: [employer] Dev[employer] job checking blood sugar at home first thing in the morning before eating and then again 2 hours after a meal The blood sugars we reviewed today are right on track and in good range Write down your blood sugars at home- we will review these during each of our follow up phone calls Check blood sugar if I feel it is too high or too low, or if you feel bad and are not sure why you feel bad Be sure to take your medications as they are prescribed; if you have  questions or concerns about your medications, please contact your doctor Take the blood sugars you write down to all of your doctor appointments  Keep up the great work checking your blood pressures at home: the blood pressures you reported today are in very  good range Please continue to write blood pressures down on paper; we will review these during our follow up phone calls Please read over the attached information about creating a Living Will and a [place] Follow Up Plan:  Telephone follow up appointment with care management team member scheduled for: Tuesday, June 28, 2021 at 10:15 am The patient has been provided with contact information for the care management team and has been advised to call with any health related questions or concerns.      Plan: Telephone follow up appointment with care management team member scheduled for:  Tuesday, June 28, 2021 at 10:15 am The patient has been provided with contact information for the care management team and has been advised to call with any health related questions or concerns  Oneta Rack, RN, BSN, [place] [phone]: direct office [phone]: mobile

## 2021-03-24 NOTE — Patient Instructions (Signed)
Informacin de la visita  [place], fue agradable hablar contigo hoy.  Lea la informacin adjunta y siga con el excelente trabajo controlando su nivel de azcar en la sangre y su presin arterial en casa!  Espero volver a hablar con usted para obtener una actualizacin el [place] 2022 a las 10:15 a. m.; est atento a mi Marine scientistllamada ese da. Llamar lo ms cerca posible de las 10:15 am.  Si necesita cancelar o reprogramar nuestra visita telefnica, llame al [phone] y [name] de nuestros guas de atencin estar encantado de ayudarle.  Espero escuchar acerca de su progreso.  No dude en comunicarse conmigo si puedo ayudarlo antes de nuestra prxima cita programada.   Visit Information  Patrick Park, it was nice talking with you today.   Please read over the attached information, and keep up the great work checking your blood sugars and blood pressures at home!   I look forward to talking to you again for an update on Tuesday, June 28, 2021 at 10:15 am - please be listening out for my call that day.  I will call as close to 10:15 am as possible.  If you need to cancel or re-schedule our telephone visit, please call [phone] and one of our care guides will be happy to assist you.  I look forward to hearing about your progress.   Please don't hesitate to contact me if I can be of assistance to you before our next scheduled appointment.   Patrick PinaLaine Mckinney [name], RN, BSN, CCRN Alumnus [facility] RN Care Coordination- [facility] [place] [phone]: direct office [phone]: mobile   Controle y administre mi nivel de azcar en la sangre y mi presin arterial Plazo: objetivo a Equities traderlargo plazo Prioridad: Media Fecha de inicio: 29/06/22 Franco NonesFecha de finalizacin prevista: 29/06/23  Franco NonesFecha de seguimiento 09/07/2020    Buen trabajo controlando el nivel de azcar en la sangre en casa a primera hora de la maana antes de comer y luego nuevamente 2 horas despus de Physiological scientistuna comida   Los niveles de azcar en la sangre que revisamos hoy estn bien encaminados y en un buen rango  Anote sus niveles de azcar en la sangre en casa; los revisaremos durante cada una de nuestras llamadas telefnicas de seguimiento.  Mida el nivel de azcar en la sangre si siento que est demasiado alto o demasiado bajo, o si se siente mal y no est seguro de por qu se siente mal.  Asegrese de tomar sus medicamentos tal como se los receten; si tiene preguntas o inquietudes sobre sus medicamentos, comunquese con su mdico  Fifth Third BancorpLleve los niveles de International aid/development workerazcar en la sangre que anota a todas sus citas con el mdico  Sigan con el gran trabajo controlando sus presiones arteriales en casa: las presiones arteriales que reportaron hoy estn en muy buen rango  Contine escribiendo la presin arterial en papel; los revisaremos durante nuestras llamadas telefnicas de seguimiento  Lea la informacin adjunta sobre cmo crear un testamento en vida y un poder notarial de atencin mdica   Porque es esto importante? Revisar su nivel de azcar en la sangre en casa ayuda a evitar que suba o baje demasiado. Escribir los Norfolk Southernresultados en un diario o registro ayuda al mdico a saber cmo cuidarlo. Su registro de Bankerazcar en la sangre debe tener la hora, la fecha y Marielos resultados. Adems, anote la cantidad de insulina u otro medicamento que toma. Otra informacin, como lo que comi, el ejercicio realizado y cmo se  senta, tambin ser til.  PATIENT GOALS:  Goals Addressed             This Visit's Progress    Monitor and Manage My Blood Sugar and my Blood Pressure       Timeframe:  Long-Range Goal Priority:  Medium Start Date:     02/23/21                        Expected End Date:    02/23/22                   Follow Up Date 06/28/2021    Great job checking blood sugar at home first thing in the morning before eating and then again 2 hours after a meal The blood sugars we reviewed today are right on track and in good  range Write down your blood sugars at home- we will review these during each of our follow up phone calls Check blood sugar if I feel it is too high or too low, or if you feel bad and are not sure why you feel bad Be sure to take your medications as they are prescribed; if you have questions or concerns about your medications, please contact your doctor Take the blood sugars you write down to all of your doctor appointments  Keep up the great work checking your blood pressures at home: the blood pressures you reported today are in very good range Please continue to write blood pressures down on paper; we will review these during our follow up phone calls Please read over the attached information about creating a Living Will and a Health Care Power of [name]   Why is this important?   Checking your blood sugar at home helps to keep it from getting very high or very low.  Writing the results in a diary or log helps the doctor know how to care for you.  Your blood sugar log should have the time, date and the results.  Also, write down the amount of insulin or other medicine that you take.  Other information, like what you ate, exercise done and how you were feeling, will also be helpful.            Critical care medicine: Principles of diagnosis and management in the adult (4th ed., pp. 7408-1448). Saunders."> Miller's anesthesia (8th ed., pp. 232-250). Saunders.">  Voluntades anticipadas Advance Directive  Las voluntades anticipadas son documentos legales que le permiten tomar decisiones acerca de su tratamiento mdico y atencin de la salud en caso de no poder expresarse usted mismo. Las voluntades anticipadas comunican sus deseos afamiliares, amigos y mdicos. La elaboracin y la redaccin de las voluntades anticipadas deben realizarse con el transcurso del [place], en lugar de que suceda todo a la misma vez. Las voluntades anticipadas se pueden cambiar y Naval architect.  Hay diferentes tipos de voluntades anticipadas, por ejemplo: Poder notarial mdico. Testamento vital. Orden de no reanimar (ONR) o de no intentar la reanimacin (ONIR). Apoderado para asuntos de salud y poder notarial mdico Al apoderado para asuntos de salud tambin se le llama representante para asuntos de Freight forwarder. Esta persona es designada para tomar decisiones mdicas en representacin suya cuando usted no pueda tomar decisiones por usted mismo. Por lo general, las Camera operator piden a un amigo o familiar de confianza que acte como su apoderado y represente sus preferencias. Asegrese de tener un acuerdo con su persona de confianza para que  acte como su apoderado. Es posible que un apoderado deba tomar una decisin mdica en su nombre si no se conocen susdeseos. Un poder notarial mdico, tambin llamado poder notarial duradero para asuntos de [place], es un documento legal que nombra a su apoderado para asuntos de Freight forwarder. En funcin de las leyes de [address], es posible que el documento tambin deba estar: [place]. Autenticado ante escribano. Fechado. Copiado. Atestiguado. Incorporado a la historia clnica del paciente. Tambin es posible que desee designar a una persona de confianza para administrar su dinero en caso de que usted no pueda hacerlo. Esto se denomina poder notarial duradero para asuntos financieros. Este es un documento legal diferente del poder notarial duradero para asuntos de salud. Usted puede elegir a su apoderado para asuntos de salud o a una persona diferente para que actecomo su apoderado en los asuntos de dinero. Si no asigna un apoderado, o si se considera que el apoderado no est actuando para su bien, un tribunal podr Sales promotion account executive legal para que acte enrepresentacin suya. El testamento vital El testamento vital es un conjunto de instrucciones en las que se establecen sus deseos acerca de la atencin mdica para el momento en que no pueda expresarlos usted mismo.  Los mdicos deben conservar una copia del testamento vital en su historia clnica. Le recomendamos que entregue una copia a familiares o amigos. Con el fin de alertar a sus cuidadores en caso de Radio broadcast assistant, puede colocar una tarjeta en la billetera que indique que tiene un testamento vital y dnde Surveyor, mining. El testamento vital se [place] si: Tiene una enfermedad terminal. Queda discapacitado. No puede comunicarse ni tomar decisiones. Debe incluir las siguientes decisiones en su testamento vital: El uso o no uso de equipos de soporte vital, como dispositivos de dilisis y Physicist, medical respiratorias (respiradores). Si desea una ONR o [place]. Esto les indica a los mdicos que no usen Equities trader (RCP) si se le detiene la respiracin o los latidos cardacos. El [place] o no uso de alimentacin por sonda. La administracin o no administracin de alimentos y lquidos. Si desea recibir atencin de alivio (cuidados paliativos) cuando el objetivo sea la comodidad ms que la [place]. Si desea donar sus rganos y tejidos. El testamento vital no da instrucciones acerca de la distribucin del dinero nilas propiedades en caso de fallecimiento. ONR u ONIR La ONR es el pedido de no Statistician en el caso de que el corazn o la respiracin se Insurance claims handler. Si no se realiz ni se present [employer] u ONIR, el mdico intentar ayudar a cualquier paciente cuyo corazn o respiracin se haya detenido. Si planea someterse a [place], hable con su mdico sobre elcumplimiento de su ONR u ONIR en caso de que surjan problemas. Qu sucede si no tengo una voluntad anticipada? Algunos estados asignan familiares a cargo de la toma de decisiones para que acten en representacin suya si no tiene una voluntad anticipada. Cada estado tiene sus propias leyes sobre las voluntades anticipadas. Es posible que desee consultar a su mdico, su abogado o al representante estatal acerca de lasleyes de su estado. Resumen Las  voluntades anticipadas son documentos legales que le permiten tomar decisiones acerca de su tratamiento mdico y atencin de la salud en caso de no poder expresarse usted mismo. El proceso de Event organiser y Armed forces training and education officer las voluntades anticipadas debe realizarse con el transcurso del [place]. Usted puede cambiar y Economist las voluntades anticipadas en cualquier momento. Las voluntades anticipadas pueden incluir un poder  mdico, un testamento vital y [place] orden ONR u ONIR. Esta informacin no tiene Theme park manager el consejo del mdico. Asegresede hacerle al mdico cualquier pregunta que tenga. Document Revised: 06/10/2020 Document Reviewed: 06/10/2020 Elsevier Patient Education  2022 [employer].    El paciente verbaliz la comprensin de las instrucciones, los materiales educativos y el plan de atencin proporcionados hoy y acept recibir por correo una copia de las instrucciones para el [place], los materiales educativos y Arts administrator de atencin.  Cita telefnica de seguimiento con un miembro del equipo de administracin de la atencin programada para el [place] de [place] de 2022 a las 10:15 a. m.  Se le proporcion al paciente la informacin de contacto del equipo de administracin de la atencin y se le aconsej que llame si tiene preguntas o inquietudes relacionadas con la salud.  The patient verbalized understanding of instructions, educational materials, and care plan provided today and agreed to receive a mailed copy of patient instructions, educational materials, and care plan.  Telephone follow up appointment with care management team member scheduled for:  Tuesday, June 28, 2021 at 10:15 am The patient has been provided with contact information for the care management team and has been advised to call with any health related questions or concerns  Patrick Pina, RN, BSN, CCRN Alumnus [facility] RN Care Coordination- [facility] [place] [phone]: direct office [phone]: mobile

## 2021-06-15 ENCOUNTER — Encounter: Payer: Self-pay | Admitting: Emergency Medicine

## 2021-06-15 ENCOUNTER — Telehealth (INDEPENDENT_AMBULATORY_CARE_PROVIDER_SITE_OTHER): Payer: Medicare Other | Admitting: Emergency Medicine

## 2021-06-15 DIAGNOSIS — R0981 Nasal congestion: Secondary | ICD-10-CM | POA: Diagnosis not present

## 2021-06-15 DIAGNOSIS — R051 Acute cough: Secondary | ICD-10-CM

## 2021-06-15 DIAGNOSIS — J0100 Acute maxillary sinusitis, unspecified: Secondary | ICD-10-CM

## 2021-06-15 MED ORDER — PREDNISONE 20 MG PO TABS
20.0000 mg | ORAL_TABLET | Freq: Every day | ORAL | 0 refills | Status: AC
Start: 2021-06-15 — End: 2021-06-20

## 2021-06-15 MED ORDER — HYDROCODONE BIT-HOMATROP MBR 5-1.5 MG/5ML PO SOLN: 5.0000 mL | Freq: Every evening | ORAL | 0 refills | Status: AC | PRN

## 2021-06-15 MED ORDER — AMOXICILLIN-POT CLAVULANATE 875-125 MG PO TABS
1.0000 | ORAL_TABLET | Freq: Two times a day (BID) | ORAL | 0 refills | Status: AC
Start: 2021-06-15 — End: 2021-06-22

## 2021-06-15 NOTE — Progress Notes (Signed)
Telemedicine Encounter- SOAP NOTE Established Patient MyChart video conference attempted but unsuccessful Patient: Home  Provider: Office   Patient present only  This telephone encounter was conducted with the patient's (or proxy's) verbal consent via audio telecommunications: yes/no: Yes Patient was instructed to have this encounter in a suitably private space; and to only have persons present to whom they give permission to participate. In addition, patient identity was confirmed by use of name plus two identifiers (DOB and address).  I discussed the limitations, risks, security and privacy concerns of performing an evaluation and management service by telephone and the availability of in person appointments. I also discussed with the patient that there may be a patient responsible charge related to this service. The patient expressed understanding and agreed to proceed.  I spent a total of TIME; 0 MIN TO 60 MIN: 20 minutes talking with the patient or their proxy.  Chief complaint: Nasal congestion  Subjective   Patrick Park is a 70 y.o. hypertensive and diabetic male established patient. Telephone visit today complaining of flulike symptoms that started almost 3 weeks ago.  Today mostly complaining of nasal congestion and productive cough with occasional headaches.  Denies difficulty breathing.  COVID tested negative twice.  No one else sick at home.  Able to eat and drink.  Denies nausea or vomiting.  Denies chest pain or palpitations.  Denies abdominal pain or diarrhea. No other significant associated symptomatology.  HPI   Patient Active Problem List   Diagnosis Date Noted   Class 1 obesity due to excess calories with serious comorbidity and body mass index (BMI) of 32.0 to 32.9 in adult 09/23/2019   Allergic rhinitis 03/28/2019   Hypertension associated with diabetes (HCC) 10/03/2018   Dyslipidemia associated with type 2 diabetes mellitus (HCC) 10/03/2018   Dyslipidemia  10/03/2018   General weakness 10/03/2018   ED (erectile dysfunction) 01/22/2013   GERD (gastroesophageal reflux disease) 10/24/2012    Past Medical History:  Diagnosis Date   Allergy    Benign paroxysmal positional vertigo    Diabetes mellitus without complication (HCC)    GERD (gastroesophageal reflux disease)    Hyperlipidemia    Hypertension     Current Outpatient Medications  Medication Sig Dispense Refill   amoxicillin-clavulanate (AUGMENTIN) 875-125 MG tablet Take 1 tablet by mouth 2 (two) times daily for 7 days. 14 tablet 0   aspirin EC 81 MG tablet Take 81 mg by mouth daily.     atorvastatin (LIPITOR) 40 MG tablet Take 1 tablet (40 mg total) by mouth daily. 90 tablet 3   Azelastine-Fluticasone (DYMISTA) 137-50 MCG/ACT SUSP Spray once in each nostril twice a day 23 g 5   dapagliflozin propanediol (FARXIGA) 10 MG TABS tablet Take 10 mg by mouth daily.     Fish Oil OIL Take 1 capsule by mouth daily.     glucose blood test strip Use to check FSBS daily. Dx: E11.9 100 each 6   HYDROcodone bit-homatropine (HYCODAN) 5-1.5 MG/5ML syrup Take 5 mLs by mouth at bedtime as needed for cough. 120 mL 0   Lancets MISC OneTouch Delica Lancets 33 gauge  check glucose levels 2-3 times a day DX: E11.65, NPI [id number], Date: 03/21/17 100 each 6   losartan-hydrochlorothiazide (HYZAAR) 50-12.5 MG tablet Take 1 tablet by mouth daily. 90 tablet 3   metFORMIN (GLUCOPHAGE) 1000 MG tablet Take 1 tablet (1,000 mg total) by mouth 2 (two) times daily with a meal. 180 tablet 3   montelukast (SINGULAIR) 10 MG  tablet Take 1 tablet (10 mg total) by mouth at bedtime. 90 tablet 1   Multiple Vitamin (MULTIVITAMIN) tablet Take 1 tablet by mouth daily.     omeprazole (PRILOSEC) 20 MG capsule Take 1 capsule (20 mg total) by mouth daily. 90 capsule 1   predniSONE (DELTASONE) 20 MG tablet Take 1 tablet (20 mg total) by mouth daily with breakfast for 5 days. 5 tablet 0   triamcinolone (KENALOG) 0.1 % Apply 1  application topically 2 (two) times daily. 30 g 0   No current facility-administered medications for this visit.    No Known Allergies  Social History   Socioeconomic History   Marital status: Married    Spouse name: Not on file   Number of children: Not on file   Years of education: Not on file   Highest education level: Not on file  Occupational History   Not on file  Tobacco Use   Smoking status: Some Days   Smokeless tobacco: Never   Tobacco comments:    0-2/day  Vaping Use   Vaping Use: Never used  Substance and Sexual Activity   Alcohol use: No   Drug use: No   Sexual activity: Not on file  Other Topics Concern   Not on file  Social History Narrative   Not on file   Social Determinants of Health   Financial Resource Strain: Low Risk    Difficulty of Paying Living Expenses: Not hard at all  Food Insecurity: No Food Insecurity   Worried About Programme researcher, broadcasting/film/video in the Last Year: Never true   Ran Out of Food in the Last Year: Never true  Transportation Needs: No Transportation Needs   Lack of Transportation (Medical): No   Lack of Transportation (Non-Medical): No  Physical Activity: Not on file  Stress: Not on file  Social Connections: Not on file  Intimate Partner Violence: Not on file    Review of Systems  Constitutional: Negative.  Negative for chills and fever.  HENT:  Positive for congestion and sinus pain. Negative for sore throat.   Respiratory:  Positive for cough and sputum production. Negative for shortness of breath and wheezing.   Cardiovascular: Negative.  Negative for chest pain and palpitations.  Gastrointestinal:  Negative for abdominal pain, diarrhea, nausea and vomiting.  Genitourinary: Negative.  Negative for dysuria and hematuria.  Musculoskeletal: Negative.   Skin: Negative.  Negative for rash.  Neurological:  Positive for headaches. Negative for dizziness.  All other systems reviewed and are negative.  Objective  Alert and  oriented x3 in no apparent respiratory distress. Vitals as reported by the patient: There were no vitals filed for this visit.  Diagnoses and all orders for this visit:  Sinus congestion -     predniSONE (DELTASONE) 20 MG tablet; Take 1 tablet (20 mg total) by mouth daily with breakfast for 5 days.  Acute cough -     HYDROcodone bit-homatropine (HYCODAN) 5-1.5 MG/5ML syrup; Take 5 mLs by mouth at bedtime as needed for cough.  Acute non-recurrent maxillary sinusitis -     amoxicillin-clavulanate (AUGMENTIN) 875-125 MG tablet; Take 1 tablet by mouth 2 (two) times daily for 7 days.  Clinically stable.  No red flag signs or symptoms.  Most likely has secondary bacterial sinus infection.  Will start antibiotics. Symptomatic treatment of cough and sinus congestion.  Failed over-the-counter Flonase. COVID test negative. ED precautions given. Advised to rest and stay well-hydrated. Advised to contact the office if no better or  worse during the next several days.   I discussed the assessment and treatment plan with the patient. The patient was provided an opportunity to ask questions and all were answered. The patient agreed with the plan and demonstrated an understanding of the instructions.   The patient was advised to call back or seek an in-person evaluation if the symptoms worsen or if the condition fails to improve as anticipated.  I provided 20 minutes of non-face-to-face time during this encounter.  [name], MD  Primary Care at [facility]

## 2021-06-28 ENCOUNTER — Ambulatory Visit (INDEPENDENT_AMBULATORY_CARE_PROVIDER_SITE_OTHER): Payer: Medicare Other | Admitting: *Deleted

## 2021-06-28 DIAGNOSIS — E785 Hyperlipidemia, unspecified: Secondary | ICD-10-CM

## 2021-06-28 DIAGNOSIS — E1169 Type 2 diabetes mellitus with other specified complication: Secondary | ICD-10-CM

## 2021-06-28 DIAGNOSIS — I10 Essential (primary) hypertension: Secondary | ICD-10-CM

## 2021-06-28 DIAGNOSIS — E119 Type 2 diabetes mellitus without complications: Secondary | ICD-10-CM

## 2021-06-28 NOTE — Patient Instructions (Addendum)
Informacin de la visita  [place], fue agradable hablar contigo hoy.  Espero volver a hablar con usted para recibir una actualizacin telefnica el martes 17 de enero de 2023 a las 9:45 am. Est atento a mi Marine scientist. Llamar lo ms cerca posible de las 9:45 a. m.  Si necesita cancelar o reprogramar nuestra visita telefnica, llame al [phone] y Juanna Cao de nuestros guas de atencin estar encantado de ayudarle.  Espero escuchar acerca de su progreso.  No dude en comunicarse conmigo si puedo ayudarlo antes de nuestra prxima cita telefnica programada.  Patrick Pina, RN, BSN, [facility] RN Care Coordination- [facility] [place] [phone]: Patrick Park [phone]: Meribeth Mattes de autocuidado del paciente:   Patrick Park cita en el consultorio en persona con el Dr. Alvy Bimler para que pueda preguntar acerca de una remisin de ENT y si debe o no vacunarse contra la gripe; tambin es hora de que su anlisis de laboratorio regular controle su diabetes.  Buen trabajo controlando el nivel de azcar en la sangre en casa a primera hora de la maana antes de comer y luego nuevamente 2 horas despus de una comida  Los niveles de International aid/development worker en la sangre que revisamos hoy estn bien encaminados y en un buen rango  Anote sus niveles de azcar en la sangre en casa; los revisaremos durante cada una de nuestras llamadas telefnicas de seguimiento.  Mida el nivel de azcar en la sangre si siento que est demasiado alto o demasiado bajo, o si se siente mal y no est seguro de por qu se siente mal.  Asegrese de tomar sus medicamentos tal como se los receten; si tiene preguntas o inquietudes sobre sus medicamentos, comunquese con su mdico  [employer] niveles de International aid/development worker en la sangre que anota a todas sus citas con el mdico  Sigan con el gran trabajo controlando sus presiones arteriales en casa: las presiones arteriales que reportaron hoy estn en muy buen rango   Contine escribiendo la presin arterial en papel; los revisaremos durante nuestras llamadas telefnicas de seguimiento  Visit Information  Patrick Park, it was nice talking with you today.    I look forward to talking to you again for a telephone update on Tuesday, September 13, 2021 at 9:45 am- please be listening out for my call that day.  I will call as close to 9:45 am as possible.   If you need to cancel or re-schedule our telephone visit, please call [phone] and one of our care guides will be happy to assist you.   I look forward to hearing about your progress.   Please don't hesitate to contact me if I can be of assistance to you before our next scheduled telephone appointment.   Patrick Pina, RN, BSN, CCRN [facility] RN Care Coordination- [facility] [phone]: direct office [phone]: mobile   Patient Self-Care Activities: Please make an in person office appointment with Dr. Alvy Bimler so you can ask about an ENT referral and whether or not you should get your flu shot- it is also time for your regular lab work to check on your diabetes Great job checking blood sugar at home first thing in the morning before eating and then again 2 hours after a meal The blood sugars we reviewed today are right on track and in good range Write down your blood sugars at home- we will review these during each of our follow up phone calls Check  blood sugar if I feel it is too high or too low, or if you feel bad and are not sure why you feel bad Be sure to take your medications as they are prescribed; if you have questions or concerns about your medications, please contact your doctor Take the blood sugars you write down to all of your doctor appointments  Keep up the great work checking your blood pressures at home: the blood pressures you reported today are in very good range Please continue to write blood pressures down on paper; we will review these during our  follow up phone calls  Gripe en los adultos Influenza, Adult A la gripe tambin se la conoce como "influenza". Es una [employer] , la nariz y la garganta (vas respiratorias). Se transmite fcilmente de persona a persona (es contagiosa). La gripe causa sntomas que son [employer] de un resfro, junto con fiebre alta y dolores corporales. Cules son las causas? La causa de esta afeccin es el virus de la influenza. Puede contraer el virus de las siguientes maneras: Al inhalar gotitas que quedan en el aire despus de que una persona infectada con gripe tosi o estornud. Al tocar algo que est contaminado con el virus y [employer] mano a la boca, la nariz o los ojos. Qu incrementa el riesgo? Hay ciertas cosas que lo pueden hacer ms propenso a Warden/ranger. Estas incluyen lo siguiente: No lavarse las manos con frecuencia. Tener contacto cercano con [employer] durante la temporada de resfro y gripe. Tocarse la boca, los ojos o la nariz sin antes lavarse las manos. No recibir la Teachers Insurance and Annuity Association. Puede correr un mayor riesgo de [place] gripe, y [place] graves, como una infeccin pulmonar (neumona), si usted: Es mayor de 65 aos de edad. Est embarazada. Tiene debilitado el sistema que combate las defensas (sistema inmunitario) debido a una enfermedad o a que toma determinados medicamentos. Tiene una afeccin a largo plazo (crnica), como las siguientes: Enfermedad cardaca, renal o pulmonar. Diabetes. Asma. Tiene un trastorno heptico. Tiene mucho sobrepeso (obesidad [place]). Tiene anemia. Cules son los signos o sntomas? Los sntomas normalmente comienzan de repente y Armando Reichert 4 y [address]. Pueden incluir los siguientes: Grant Ruts y escalofros. Dolores de [place], dolores en el cuerpo o dolores musculares. Dolor de Advertising copywriter. Tos. Secrecin o congestin nasal. Molestias en el pecho. No querer comer tanto como lo hace normalmente. Sensacin  de debilidad o cansancio. Mareos. Malestar estomacal o vmitos. Cmo se trata? Si la gripe se detecta de forma temprana, puede recibir tratamiento con medicamentos antivirales. Esto puede ayudar a reducir la gravedad y la duracin de la enfermedad. Se los administrarn por boca o a travs de un tubo (catter) intravenoso. Cuidarse en su hogar puede ayudar a que mejoren los sntomas. El mdico puede recomendarle lo siguiente: Tomar medicamentos de Sales promotion account executive. Beber abundante lquido. La gripe suele desaparecer sola. Si tiene sntomas muy graves u otros problemas, puede recibir tratamiento en un hospital. Siga estas instrucciones en su casa:   Actividad Descanse todo lo que sea necesario. Duerma lo suficiente. Lanny Hurst en su casa y no concurra al Aleen Campi o a la escuela, como se lo haya indicado el mdico. No salga de su casa hasta que no haya tenido fiebre por 24 horas sin tomar medicamentos. Salga de su casa solamente para ir al [employer]. Comida y bebida Beverely Risen SRO (solucin de rehidratacin oral). Es Neomia Dear bebida que se vende en farmacias y tiendas. Beba suficiente lquido  como para Photographer orina de color amarillo plido. En la medida en que pueda, beba lquidos transparentes en pequeas cantidades. Los lquidos transparentes son, por ejemplo: Westley Hummer. Trocitos de hielo. Jugo de frutas mezclado con agua. Bebidas deportivas de bajas caloras. Coma alimentos suaves que sean fciles de digerir. En la medida que pueda, consuma pequeas cantidades. Estos alimentos incluyen: Bananas. Pur de [employer]. Arroz. Carnes magras. Tostadas. Galletas. No coma ni beba lo siguiente: Lquidos con alto contenido de azcar o cafena. Alcohol. Alimentos condimentados o con alto contenido de [place] Indicaciones generales Use los medicamentos de venta libre y los recetados solamente como se lo haya indicado el mdico. Use un humidificador de aire fro para que el aire de su casa est ms hmedo. Esto  puede facilitar la respiracin. Cuando utilice un humidificador de vapor fro, lmpielo a diario. Vace el agua y [place] por agua limpia. Al toser o estornudar, cbrase la boca y la [place]. Lvese las manos frecuentemente con agua y [place] y durante al menos 20 segundos. Esto tambin es importante despus de toser o Engineering geologist. Si no dispone de [place] y [place], use desinfectante para manos con alcohol. Cumpla con todas las visitas de seguimiento. Cmo se previene?  Colquese la vacuna antigripal todos los Terminous. Puede colocarse la vacuna contra la gripe a fines de verano, en otoo o en invierno. Pregntele al mdico cundo debe aplicarse la vacuna contra la gripe. Evite el contacto con personas que estn enfermas durante el otoo y el invierno. Es la temporada del resfro y Emergency planning/management officer. Comunquese con un mdico si: Tiene sntomas nuevos. Tiene los siguientes sntomas: Dolor de [place]. Materia fecal lquida (diarrea). Grant Ruts. La tos empeora. Empieza a tener ms mucosidad. Tiene Programme researcher, broadcasting/film/video. Vomita. Solicite ayuda de inmediato si: Le falta el aire. Tiene dificultad para respirar. La piel o las uas se ponen de un color azulado. Presenta dolor muy intenso o rigidez en el cuello. Tiene dolor de cabeza repentino. Le duele la cara o el odo de forma repentina. No puede comer ni beber sin vomitar. Estos sntomas pueden representar un problema grave que constituye Radio broadcast assistant. Solicite atencin mdica de inmediato. Comunquese con el servicio de emergencias de su localidad (911 en los [place]). No espere a ver si los sntomas desaparecen. No conduzca por sus propios medios [employer]. Resumen A la gripe tambin se la conoce como "influenza". Es una [employer] , la nariz y Administrator. Se transmite fcilmente de [place] persona a otra. Use los medicamentos de venta libre y los recetados solamente como se lo haya indicado el mdico. Aplicarse la vacuna contra la  gripe todos los aos es la mejor manera de no contagiarse la gripe. Esta informacin no tiene Theme park manager el consejo del mdico. Asegrese de hacerle al mdico cualquier pregunta que tenga. Document Revised: 06/10/2020 Document Reviewed: 06/10/2020 Elsevier Patient Education  2022 [employer].   El paciente verbaliz la comprensin de las instrucciones, los materiales educativos y el plan de atencin proporcionados hoy y acept recibir por correo una copia de las instrucciones para el [place], los materiales educativos y Arts administrator de atencin.  Cita telefnica de seguimiento con un miembro del equipo de administracin de la atencin programada para el martes 17 de enero de 2023 a las 9:45 a. m.  Se le proporcion al paciente la informacin de contacto del equipo de administracin de la atencin y se le aconsej que llame si tiene preguntas o inquietudes relacionadas con la salud.  The patient verbalized understanding of instructions, educational materials, and care plan provided today and agreed to receive a mailed copy of patient instructions, educational materials, and care plan Telephone follow up appointment with care management team member scheduled for:  Tuesday, September 13, 2021 at 9:45 am The patient has been provided with contact information for the care management team and has been advised to call with any health related questions or concerns  Patrick Pina, RN, BSN, CCRN Alumn[facility] RN Care Coordination- [facility] [place] [phone]: direct office [phone]: mobile

## 2021-06-28 NOTE — Chronic Care Management (AMB) (Signed)
Chronic Care Management   CCM RN Visit Note  06/28/2021 Name: Patrick Park MRN: [id number] DOB: 11-13-1950  Subjective: Patrick Park is a 70 y.o. year old male who is a primary care patient of [name], Ines Bloomer, MD. The care management team was consulted for assistance with disease management and care coordination needs.    Engaged with patient by telephone using [employer] for follow up visit in response to provider referral for case management and/or care coordination services.   Consent to Services:  The patient was given information about Chronic Care Management services, agreed to services, and gave verbal consent prior to initiation of services.  Please see initial visit note for detailed documentation.  Patient agreed to services and verbal consent obtained.   Assessment: Review of patient past medical history, allergies, medications, health status, including review of consultants reports, laboratory and other test data, was performed as part of comprehensive evaluation and provision of chronic care management services.   SDOH (Social Determinants of Health) assessments and interventions performed:  SDOH Interventions    Flowsheet Row Most Recent Value  SDOH Interventions   Food Insecurity Interventions Intervention Not Indicated  Transportation Interventions Intervention Not Indicated  [Patient continues to drive self,  continues working]       CCM Care Plan No Known Allergies  Outpatient Encounter Medications as of 06/28/2021  Medication Sig Note   dapagliflozin propanediol (FARXIGA) 10 MG TABS tablet Take 10 mg by mouth daily.    metFORMIN (GLUCOPHAGE) 1000 MG tablet Take 1 tablet (1,000 mg total) by mouth 2 (two) times daily with a meal. 06/28/2021: 06/28/21: Reports taking 500 mg po QD; states 1000 mg "was too much"- takes at lunch time   aspirin EC 81 MG tablet Take 81 mg by mouth daily.    atorvastatin (LIPITOR) 40 MG tablet Take 1 tablet (40  mg total) by mouth daily.    Azelastine-Fluticasone (DYMISTA) 137-50 MCG/ACT SUSP Spray once in each nostril twice a day    Fish Oil OIL Take 1 capsule by mouth daily.    glucose blood test strip Use to check FSBS daily. Dx: E11.9    HYDROcodone bit-homatropine (HYCODAN) 5-1.5 MG/5ML syrup Take 5 mLs by mouth at bedtime as needed for cough.    Lancets MISC OneTouch Delica Lancets 33 gauge  check glucose levels 2-3 times a day DX: E11.65, NPI [id number], Date: 03/21/17    losartan-hydrochlorothiazide (HYZAAR) 50-12.5 MG tablet Take 1 tablet by mouth daily.    montelukast (SINGULAIR) 10 MG tablet Take 1 tablet (10 mg total) by mouth at bedtime.    Multiple Vitamin (MULTIVITAMIN) tablet Take 1 tablet by mouth daily.    omeprazole (PRILOSEC) 20 MG capsule Take 1 capsule (20 mg total) by mouth daily.    triamcinolone (KENALOG) 0.1 % Apply 1 application topically 2 (two) times daily.    No facility-administered encounter medications on file as of 06/28/2021.   Patient Active Problem List   Diagnosis Date Noted   Class 1 obesity due to excess calories with serious comorbidity and body mass index (BMI) of 32.0 to 32.9 in adult 09/23/2019   Allergic rhinitis 03/28/2019   Hypertension associated with diabetes ([place]) 10/03/2018   Dyslipidemia associated with type 2 diabetes mellitus ([place]) 10/03/2018   Dyslipidemia 10/03/2018   General weakness 10/03/2018   ED (erectile dysfunction) 01/22/2013   GERD (gastroesophageal reflux disease) 10/24/2012   Conditions to be addressed/monitored:  HTN, HLD, and DMII  Care Plan : Diabetes Type 2 (Adult)  Updates made by Knox Royalty, RN since 06/28/2021 12:00 AM     Problem: Glycemic Management (Diabetes, Type 2)   Priority: Medium     Long-Range Goal: Glycemic Management Optimized in setting of HTN   Start Date: 02/23/2021  Expected End Date: 02/23/2022  This Visit's Progress: On track  Recent Progress: On track  Priority: Medium  Note:   Objective:   Lab Results  Component Value Date   HGBA1C 7.8 (A) 02/15/2021   Lab Results  Component Value Date   CREATININE 1.02 02/15/2021   CREATININE 0.97 10/26/2020   CREATININE 0.75 (L) 07/27/2020   Lab Results  Component Value Date   EGFR 85 10/26/2020   Current Barriers:  Knowledge Deficits related to basic Diabetes pathophysiology and self care/management- will require ongoing reinforcement of same Spanish speaking patient- requires interpreter services Does not consistently adhere to provider recommendations re: low carb/ low sugar/ low salt diet Case Manager Clinical Goal(s):  02/23/21: Over the next 12 months, patient will demonstrate ongoing adherence to prescribed treatment plan for diabetes self care/management in setting of concurrent HTN as evidenced by patient reporting during CCM RN CM outreach of:  Daily monitoring and recording of CBG twice daily: fasting and 2-hours post-prandial  Adherence to/ improvement of following ADA/ carb modified diet  Adherence to prescribed medication regimen  Monitoring and recording blood pressures at home weekly Contacting care providers for new or worsened symptoms, concerns or questions Interventions:  Collaboration with Horald Pollen, MD regarding development and update of comprehensive plan of care as evidenced by provider attestation and co-signature Inter-disciplinary care team collaboration (see longitudinal plan of care) Review of patient status, including review of consultants reports, relevant laboratory and other test results, and medications completed Interpreter services utilized to complete medication review/ initial assessment Reviewed recent PCP virtual visit: patient reports he has completed antibiotics and prednisone therapy as prescribed-- however, he reports ongoing "issues with my nose," which he repeatedly described as "problems with my nose;" 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 and he thinks this may be a  contributing factor-- states he wants an ENT referral; advised patient to make in-person appointment with PCP to address his concerns; also reports he has not yet obtained flu vaccine for 2022-23 flu/ winter season: states he is not sure he should get this vaccine due to his ongoing symptoms with "nose;" I advised him to discuss with PCP when he makes in -person office visit appointment Updated SDOH; confirmed no changes, patient continues to work; continues independent in self-care Reviewed recent blood sugars at home: reports fasting blood sugars consistently between 108-120; occasionally as low as "98;" reports post-prandials consistently between 115-150; denies low values < 80; denies signs/ symptoms hypoglycemia Reports no longer taking metformin 1000 mg QD-- reports now taking 500 mg QD- states "1000 mg was too much;" medication list in EHR updated accordingly; confirms also taking [place] as prescribed Reviewed recent blood pressures at home: reports consistent values between 138-145/ 74-86 Discussed provided education around rare episodes he reports today of dizziness: patient believes this is related to his "nose;" however, states these episodes do not occur frequently/ recently; discussed that dizziness could also be related to his blood pressure medication-- encouraged him to contact PCP should these episodes become more frequent or he is concerned Confirmed no recent falls; does not use assistive devices Confirmed he continues to "try to" follow heart healthy, low salt/ low cholesterol, low carbohydrate, low sugar diet "as best as I can"  Confirmed no upcoming scheduled provider appointments- as above, encouraged patient to schedule PCP in person office visit  Discussed plans with patient for ongoing care management follow up and provided patient with direct contact information for care management team Self-Care Activities Self administers oral medications as prescribed Attends all scheduled  provider appointments Checks blood sugars and blood pressures at home daily  Patient Goals: Please make an in person office appointment with Dr. Mitchel Honour so you can ask about an ENT referral and whether or not you should get your flu shot- it is also time for your regular lab work to check on your diabetes Great job checking blood sugar at home first thing in the morning before eating and then again 2 hours after a meal The blood sugars we reviewed today are right on track and in good range Write down your blood sugars at home- we will review these during each of our follow up phone calls Check blood sugar if I feel it is too high or too low, or if you feel bad and are not sure why you feel bad Be sure to take your medications as they are prescribed; if you have questions or concerns about your medications, please contact your doctor Take the blood sugars you write down to all of your doctor appointments  Keep up the great work checking your blood pressures at home: the blood pressures you reported today are in very good range Please continue to write blood pressures down on paper; we will review these during our follow up phone calls Follow Up Plan:  Telephone follow up appointment with care management team member scheduled for: Tuesday, September 13, 2021 at 9:45 am The patient has been provided with contact information for the care management team and has been advised to call with any health related questions or concerns    Plan: Telephone follow up appointment with care management team member scheduled for:  Tuesday, September 13, 2021 at 9:45 am The patient has been provided with contact information for the care management team and has been advised to call with any health related questions or concerns  Oneta Rack, RN, BSN, [place] [phone]: direct office [phone]: mobile

## 2021-07-27 DIAGNOSIS — I10 Essential (primary) hypertension: Secondary | ICD-10-CM

## 2021-07-27 DIAGNOSIS — E1169 Type 2 diabetes mellitus with other specified complication: Secondary | ICD-10-CM

## 2021-07-27 DIAGNOSIS — E785 Hyperlipidemia, unspecified: Secondary | ICD-10-CM

## 2021-07-27 DIAGNOSIS — E119 Type 2 diabetes mellitus without complications: Secondary | ICD-10-CM | POA: Diagnosis not present

## 2021-08-04 ENCOUNTER — Other Ambulatory Visit: Payer: Self-pay | Admitting: Emergency Medicine

## 2021-09-13 ENCOUNTER — Encounter: Payer: Self-pay | Admitting: *Deleted

## 2021-09-13 ENCOUNTER — Telehealth: Payer: Medicare Other

## 2021-09-13 ENCOUNTER — Telehealth: Payer: Self-pay | Admitting: *Deleted

## 2021-09-13 NOTE — Telephone Encounter (Signed)
°  Chronic Care Management   Follow Up Note   09/13/2021 Name: Patrick Park MRN: [id number] DOB: Jun 06, 1951  Referred by: [name], MD Reason for referral : Chronic Care Management (CCM RN CM Follow Up Outreach; DMII; HTN- unsuccessful outreach attempt)  An unsuccessful telephone outreach was attempted today. The patient was referred to the case management team for assistance with care management and care coordination.   Call facilitated by [facility] interpreter Services, Translator/ interpreter "Gershon Cull" [place] [phone]: she left voicemail message for patient, explaining that I would have CCM scheduling care guide team outreach patient to re-schedule today's missed follow up appointment: call requires use of interpreter services  Follow Up Plan:  A HIPPA compliant phone message was left for the patient  Will place request with scheduling care guide to contact patient to re-schedule today's missed CCM RN follow up telephone appointment   [name], RN, BSN, CCRN Alumnus [facility] Clinic RN Care Coordination- [facility] [place] [phone]: direct office

## 2021-10-03 ENCOUNTER — Telehealth: Payer: Self-pay | Admitting: *Deleted

## 2021-10-03 ENCOUNTER — Encounter: Payer: Self-pay | Admitting: *Deleted

## 2021-10-03 ENCOUNTER — Ambulatory Visit (INDEPENDENT_AMBULATORY_CARE_PROVIDER_SITE_OTHER): Payer: Medicare Other | Admitting: *Deleted

## 2021-10-03 DIAGNOSIS — I10 Essential (primary) hypertension: Secondary | ICD-10-CM

## 2021-10-03 DIAGNOSIS — E119 Type 2 diabetes mellitus without complications: Secondary | ICD-10-CM

## 2021-10-03 NOTE — Chronic Care Management (AMB) (Signed)
Chronic Care Management   CCM RN Visit Note  10/03/2021 Name: Patrick Park MRN: [id number] DOB: 11/26/1950  Subjective: Patrick Park is a 71 y.o. year old male who is a primary care patient of [name], Ines Bloomer, MD. The care management team was consulted for assistance with disease management and care coordination needs.    Collaboration with CCM scheduling care guide  for  CCM case closure  in response to provider referral for case management and/or care coordination services.   Consent to Services:  The patient was given information about Chronic Care Management services, agreed to services, and gave verbal consent prior to initiation of services.  Please see initial visit note for detailed documentation.  Patient agreed to services and verbal consent obtained.   Assessment: Review of patient past medical history, allergies, medications, health status, including review of consultants reports, laboratory and other test data, was performed as part of comprehensive evaluation and provision of chronic care management services.  CCM Care Plan No Known Allergies  Outpatient Encounter Medications as of 10/03/2021  Medication Sig Note   aspirin EC 81 MG tablet Take 81 mg by mouth daily.    atorvastatin (LIPITOR) 40 MG tablet Take 1 tablet (40 mg total) by mouth daily.    Azelastine-Fluticasone (DYMISTA) 137-50 MCG/ACT SUSP Spray once in each nostril twice a day    FARXIGA 10 MG TABS tablet TAKE 1 TABLET (10 MG TOTAL) BY MOUTH DAILY BEFORE BREAKFAST.    Fish Oil OIL Take 1 capsule by mouth daily.    glucose blood test strip Use to check FSBS daily. Dx: E11.9    HYDROcodone bit-homatropine (HYCODAN) 5-1.5 MG/5ML syrup Take 5 mLs by mouth at bedtime as needed for cough.    Lancets MISC OneTouch Delica Lancets 33 gauge  check glucose levels 2-3 times a day DX: E11.65, NPI [id number], Date: 03/21/17    losartan-hydrochlorothiazide (HYZAAR) 50-12.5 MG tablet Take 1 tablet by mouth daily.     metFORMIN (GLUCOPHAGE) 1000 MG tablet Take 1 tablet (1,000 mg total) by mouth 2 (two) times daily with a meal. 06/28/2021: 06/28/21: Reports taking 500 mg po QD; states 1000 mg "was too much"- takes at lunch time   montelukast (SINGULAIR) 10 MG tablet Take 1 tablet (10 mg total) by mouth at bedtime.    Multiple Vitamin (MULTIVITAMIN) tablet Take 1 tablet by mouth daily.    omeprazole (PRILOSEC) 20 MG capsule Take 1 capsule (20 mg total) by mouth daily.    triamcinolone (KENALOG) 0.1 % Apply 1 application topically 2 (two) times daily.    No facility-administered encounter medications on file as of 10/03/2021.   Patient Active Problem List   Diagnosis Date Noted   Class 1 obesity due to excess calories with serious comorbidity and body mass index (BMI) of 32.0 to 32.9 in adult 09/23/2019   Allergic rhinitis 03/28/2019   Hypertension associated with diabetes ([place]) 10/03/2018   Dyslipidemia associated with type 2 diabetes mellitus ([place]) 10/03/2018   Dyslipidemia 10/03/2018   General weakness 10/03/2018   ED (erectile dysfunction) 01/22/2013   GERD (gastroesophageal reflux disease) 10/24/2012   Conditions to be addressed/monitored:   HTN and DMII  Care Plan : Diabetes Type 2 (Adult)  Updates made by Knox Royalty, RN since 10/03/2021 12:00 AM     Problem: Glycemic Management (Diabetes, Type 2)   Priority: Medium     Long-Range Goal: Glycemic Management Optimized in setting of HTN   Start Date: 02/23/2021  Expected End Date: 02/23/2022  Progress: On track  °Priority: Medium  °Note:   °Objective:  °Lab Results  °Component Value Date  ° HGBA1C 7.8 (A) 02/15/2021  ° °Lab Results  °Component Value Date  ° CREATININE 1.02 02/15/2021  ° CREATININE 0.97 10/26/2020  ° CREATININE 0.75 (L) 07/27/2020  ° °Lab Results  °Component Value Date  ° EGFR 85 10/26/2020  ° °Current Barriers:  °Knowledge Deficits related to basic Diabetes pathophysiology and self care/management- will require ongoing  reinforcement of same °Spanish speaking patient- requires interpreter services °Does not consistently adhere to provider recommendations re: low carb/ low sugar/ low salt diet °Case Manager Clinical Goal(s):  °02/23/21: Over the next 12 months, patient will demonstrate ongoing adherence to prescribed treatment plan for diabetes self care/management in setting of concurrent HTN as evidenced by patient reporting during CCM RN CM outreach of:  °Daily monitoring and recording of CBG twice daily: fasting and 2-hours post-prandial  °Adherence to/ improvement of following ADA/ carb modified diet  °Adherence to prescribed medication regimen  °Monitoring and recording blood pressures at home weekly °Contacting care providers for new or worsened symptoms, concerns or questions °Interventions:  °Collaboration with [name], [name], MD regarding development and update of comprehensive plan of care as evidenced by provider attestation and co-signature °Inter-disciplinary care team collaboration (see longitudinal plan of care) °Review of patient status, including review of consultants reports, relevant laboratory and other test results, and medications completed ° °10/03/21: Notified by CCM Scheduling Care Guide that patient has declines ongoing CCM participation and reports he is changing PCP provider; CCM case closure accordingly °  ° °Plan: °No further follow up required: CCM case closure today; PCP made aware accordingly ° °[name] Tousey, RN, BSN, CCRN Alumnus °CCM Clinic RN Care Coordination- [facility] °[phone]: direct office ° ° ° ° ° ° ° ° °

## 2021-10-03 NOTE — Chronic Care Management (AMB) (Signed)
°  Care Management   Note  10/03/2021 Name: Patrick Park MRN: [id number] DOB: 11/16/1950  Patrick Park is a 71 y.o. year old male who is a primary care patient of [name], MD and is actively engaged with the care management team. I reached out to [name] by phone today using [employer] interpreter services [phone] to assist with re-scheduling a follow up visit with the RN Case Manager  Follow up plan: Patient declines further follow up and engagement by the care management team. Appropriate care team members and provider have been notified via electronic communication. The care management team is available to follow up with the patient after provider conversation with the patient regarding recommendation for care management engagement and subsequent re-referral to the care management team.   [facility] Guide, Embedded Care Coordination [facility]   Care Management  Direct Dial: [phone]

## 2021-10-25 DIAGNOSIS — E119 Type 2 diabetes mellitus without complications: Secondary | ICD-10-CM

## 2021-10-25 DIAGNOSIS — I10 Essential (primary) hypertension: Secondary | ICD-10-CM

## 2021-11-23 ENCOUNTER — Other Ambulatory Visit: Payer: Self-pay | Admitting: Emergency Medicine

## 2021-11-23 DIAGNOSIS — E1165 Type 2 diabetes mellitus with hyperglycemia: Secondary | ICD-10-CM

## 2022-03-06 ENCOUNTER — Other Ambulatory Visit: Payer: Self-pay | Admitting: Internal Medicine

## 2022-03-06 DIAGNOSIS — F17200 Nicotine dependence, unspecified, uncomplicated: Secondary | ICD-10-CM

## 2022-03-14 ENCOUNTER — Ambulatory Visit: Payer: Medicare Other

## 2022-03-22 ENCOUNTER — Ambulatory Visit
Admission: RE | Admit: 2022-03-22 | Discharge: 2022-03-22 | Disposition: A | Discharge status: Home / Self Care | Payer: Medicare Other | Source: Ambulatory Visit | Attending: Internal Medicine | Admitting: Internal Medicine

## 2022-03-22 DIAGNOSIS — F17200 Nicotine dependence, unspecified, uncomplicated: Secondary | ICD-10-CM

## 2022-04-18 ENCOUNTER — Other Ambulatory Visit: Payer: Self-pay | Admitting: Internal Medicine

## 2022-04-18 ENCOUNTER — Ambulatory Visit
Admission: RE | Admit: 2022-04-18 | Discharge: 2022-04-18 | Disposition: A | Discharge status: Home / Self Care | Payer: Medicare Other | Source: Ambulatory Visit | Attending: Internal Medicine | Admitting: Internal Medicine

## 2022-04-18 DIAGNOSIS — W19XXXA Unspecified fall, initial encounter: Secondary | ICD-10-CM

## 2022-05-04 ENCOUNTER — Ambulatory Visit
Admission: RE | Admit: 2022-05-04 | Discharge: 2022-05-04 | Disposition: A | Discharge status: Home / Self Care | Payer: Medicare Other | Source: Ambulatory Visit | Attending: Family Medicine | Admitting: Family Medicine

## 2022-05-04 ENCOUNTER — Other Ambulatory Visit: Payer: Self-pay | Admitting: Family Medicine

## 2022-05-04 DIAGNOSIS — S272XXA Traumatic hemopneumothorax, initial encounter: Secondary | ICD-10-CM

## 2022-05-30 ENCOUNTER — Institutional Professional Consult (permissible substitution): Payer: Medicare Other | Admitting: Pulmonary Disease

## 2022-08-19 ENCOUNTER — Ambulatory Visit (HOSPITAL_COMMUNITY)
Admission: EM | Admit: 2022-08-19 | Discharge: 2022-08-19 | Disposition: A | Discharge status: Home / Self Care | Payer: Medicare Other | Attending: Emergency Medicine | Admitting: Emergency Medicine

## 2022-08-19 ENCOUNTER — Encounter (HOSPITAL_COMMUNITY): Payer: Self-pay | Admitting: Emergency Medicine

## 2022-08-19 ENCOUNTER — Emergency Department (HOSPITAL_COMMUNITY)
Admission: EM | Admit: 2022-08-19 | Discharge: 2022-08-19 | Discharge status: Against Medical Advice | Payer: Medicare Other | Attending: Emergency Medicine | Admitting: Emergency Medicine

## 2022-08-19 ENCOUNTER — Other Ambulatory Visit: Payer: Self-pay

## 2022-08-19 DIAGNOSIS — R109 Unspecified abdominal pain: Secondary | ICD-10-CM | POA: Diagnosis present

## 2022-08-19 DIAGNOSIS — R110 Nausea: Secondary | ICD-10-CM | POA: Diagnosis not present

## 2022-08-19 DIAGNOSIS — Z5329 Procedure and treatment not carried out because of patient's decision for other reasons: Secondary | ICD-10-CM | POA: Insufficient documentation

## 2022-08-19 DIAGNOSIS — K529 Noninfective gastroenteritis and colitis, unspecified: Secondary | ICD-10-CM | POA: Diagnosis not present

## 2022-08-19 LAB — URINALYSIS, ROUTINE W REFLEX MICROSCOPIC
Bacteria, UA: NONE SEEN
Bilirubin Urine: NEGATIVE
Glucose, UA: >=500 mg/dL — AB
Hgb urine dipstick: NEGATIVE
Ketones, ur: 20 mg/dL — AB
Leukocytes,Ua: NEGATIVE
Nitrite: NEGATIVE
Protein, ur: NEGATIVE mg/dL
Specific Gravity, Urine: 1.032 — ABNORMAL HIGH (ref 1.005–1.030)
pH: 5 (ref 5.0–8.0)

## 2022-08-19 LAB — CBG MONITORING, ED: Glucose-Capillary: 208 mg/dL — ABNORMAL HIGH (ref 70–99)

## 2022-08-19 LAB — CBC WITH DIFFERENTIAL/PLATELET
Abs Immature Granulocytes: 0.02 10*3/uL (ref 0.00–0.07)
Basophils Absolute: 0 10*3/uL (ref 0.0–0.1)
Basophils Relative: 0 %
Eosinophils Absolute: 0 10*3/uL (ref 0.0–0.5)
Eosinophils Relative: 0 %
HCT: 52.4 % — ABNORMAL HIGH (ref 39.0–52.0)
Hemoglobin: 18.8 g/dL — ABNORMAL HIGH (ref 13.0–17.0)
Immature Granulocytes: 0 %
Lymphocytes Relative: 9 %
Lymphs Abs: 1 10*3/uL (ref 0.7–4.0)
MCH: 31.9 pg (ref 26.0–34.0)
MCHC: 35.9 g/dL (ref 30.0–36.0)
MCV: 89 fL (ref 80.0–100.0)
Monocytes Absolute: 0.7 10*3/uL (ref 0.1–1.0)
Monocytes Relative: 7 %
Neutro Abs: 8.8 10*3/uL — ABNORMAL HIGH (ref 1.7–7.7)
Neutrophils Relative %: 84 %
Platelets: 250 10*3/uL (ref 150–400)
RBC: 5.89 MIL/uL — ABNORMAL HIGH (ref 4.22–5.81)
RDW: 13.8 % (ref 11.5–15.5)
WBC: 10.6 10*3/uL — ABNORMAL HIGH (ref 4.0–10.5)
nRBC: 0 % (ref 0.0–0.2)

## 2022-08-19 LAB — COMPREHENSIVE METABOLIC PANEL
ALT: 45 U/L — ABNORMAL HIGH (ref 0–44)
AST: 28 U/L (ref 15–41)
Albumin: 4.4 g/dL (ref 3.5–5.0)
Alkaline Phosphatase: 121 U/L (ref 38–126)
Anion gap: 15 (ref 5–15)
BUN: 24 mg/dL — ABNORMAL HIGH (ref 8–23)
CO2: 30 mmol/L (ref 22–32)
Calcium: 10.3 mg/dL (ref 8.9–10.3)
Chloride: 91 mmol/L — ABNORMAL LOW (ref 98–111)
Creatinine, Ser: 1.19 mg/dL (ref 0.61–1.24)
GFR, Estimated: >60 mL/min (ref >60)
Glucose, Bld: 255 mg/dL — ABNORMAL HIGH (ref 70–99)
Potassium: 3.8 mmol/L (ref 3.5–5.1)
Sodium: 136 mmol/L (ref 135–145)
Total Bilirubin: 0.9 mg/dL (ref 0.3–1.2)
Total Protein: 7.8 g/dL (ref 6.5–8.1)

## 2022-08-19 LAB — LIPASE, BLOOD: Lipase: 33 U/L (ref 11–51)

## 2022-08-19 MED ORDER — AZITHROMYCIN 500 MG PO TABS
500.0000 mg | ORAL_TABLET | Freq: Every day | ORAL | 0 refills | Status: AC
Start: 2022-08-19 — End: 2022-08-22

## 2022-08-19 MED ORDER — ONDANSETRON 4 MG PO TBDP: 4.0000 mg | ORAL_TABLET | Freq: Three times a day (TID) | ORAL | 0 refills | Status: AC | PRN

## 2022-08-19 NOTE — ED Triage Notes (Signed)
Patient suspecting he has food poisoning as he has been having nausea and vomiting since last night after eating a large serving of a vegetable platter. Nobody else at home is feeling sick. C/o generalized abdominal pain only when vomiting- feels improvement in pain now. Endorses episodes of diarrhea.

## 2022-08-19 NOTE — ED Notes (Addendum)
Pt visitor came from [facility] and UC staff told visitor that it was good for visitor to bring pt from ED over to UC ot be seen. This NT reiterated that UC might send pt back to the ED for further evaluation, but understood and left with pt.

## 2022-08-19 NOTE — ED Provider Triage Note (Signed)
Emergency Medicine Provider Triage Evaluation Note  Patrick Park , a 71 y.o. male  was evaluated in triage.  Pt complains of generalized abdominal pain onset last night.  Notes that he was eating a veggie platter when his symptoms started.  Denies sick contacts with similar symptoms.  Has associated nausea, vomiting.  Denies history of similar symptoms.  No meds tried at home.  Denies chest pain, shortness of breath, urinary symptoms.  Review of Systems  Positive:  Negative:   Physical Exam  BP 134/81   Pulse (!) 133   Temp 98.3 F (36.8 C)   Resp 18   SpO2 97%  Gen:   Awake, no distress   Resp:  Normal effort  MSK:   Moves extremities without difficulty  Other:  No abdominal TTP.   Medical Decision Making  Medically screening exam initiated at 10:16 AM.  Appropriate orders placed.  Patrick Park was informed that the remainder of the evaluation will be completed by another provider, this initial triage assessment does not replace that evaluation, and the importance of remaining in the ED until their evaluation is complete.    Wessie Shanks A, PA-C 08/19/22 1021

## 2022-08-19 NOTE — ED Provider Notes (Signed)
[facility]    CSN: [id number] Arrival date & time: 08/19/22  1341      History   Chief Complaint Chief Complaint  Patient presents with   Emesis   Abdominal Pain    HPI Patrick Park is a 71 y.o. male.   Patient presents for evaluation of abdominal pain, vomiting and diarrhea beginning 3 days ago after eating veggie tray at home.  Vomited approximately 8-9 times with last occurrence this morning.  Diarrhea that was described as watery subsided 2 days ago.  Right lower quadrant abdominal pain is only experienced when vomiting occurs, has subsided at this time.  No other member of household has similar symptoms, no sick contacts, no recent travel.  Denies fever chills or URI symptoms.  Has been able to tolerate food and liquids since.  History of diabetes, endorses well-controlled, takes medicine as prescribed, he endorses baseline point-of-care CBG is 100-150.     Past Medical History:  Diagnosis Date   Allergy    Benign paroxysmal positional vertigo    Diabetes mellitus without complication (HCC)    GERD (gastroesophageal reflux disease)    Hyperlipidemia    Hypertension     Patient Active Problem List   Diagnosis Date Noted   Class 1 obesity due to excess calories with serious comorbidity and body mass index (BMI) of 32.0 to 32.9 in adult 09/23/2019   Allergic rhinitis 03/28/2019   Hypertension associated with diabetes (HCC) 10/03/2018   Dyslipidemia associated with type 2 diabetes mellitus (HCC) 10/03/2018   Dyslipidemia 10/03/2018   General weakness 10/03/2018   ED (erectile dysfunction) 01/22/2013   GERD (gastroesophageal reflux disease) 10/24/2012    Past Surgical History:  Procedure Laterality Date   COLONOSCOPY     11-12 yrs ago- normal per pt-pt doesnt remeber where he had the colonoscopy    HERNIA REPAIR         Home Medications    Prior to Admission medications   Medication Sig Start Date End Date Taking? Authorizing Provider   aspirin EC 81 MG tablet Take 81 mg by mouth daily.    [provider]  atorvastatin (LIPITOR) 40 MG tablet Take 1 tablet (40 mg total) by mouth daily. 02/15/21   [name], MD  Azelastine-Fluticasone [facility]) [phone] MCG/ACT SUSP Spray once in each nostril twice a day 10/26/20   [name], [name], MD  FARXIGA 10 MG TABS tablet TAKE 1 TABLET (10 MG TOTAL) BY MOUTH DAILY BEFORE BREAKFAST. 08/05/21   [name], MD  Fish Oil OIL Take 1 capsule by mouth daily.    [provider]  glucose blood test strip Use to check FSBS daily. Dx: E11.9 10/26/20   [name], MD  HYDROcodone bit-homatropine [facility]) 5-1.5 MG/5ML syrup Take 5 mLs by mouth at bedtime as needed for cough. 06/15/21   [name], MD  Lancets MISC OneTouch Delica Lancets 33 gauge  check glucose levels 2-3 times a day DX: E11.65, NPI [id number], Date: 03/21/17 10/26/20   [name], MD  losartan-hydrochlorothiazide [facility]) 50-12.5 MG tablet Take 1 tablet by mouth daily. 02/15/21   [name], MD  metFORMIN (GLUCOPHAGE) 1000 MG tablet Take 1 tablet (1,000 mg total) by mouth 2 (two) times daily with a meal. 02/15/21   [name], [name], MD  montelukast (SINGULAIR) 10 MG tablet Take 1 tablet (10 mg total) by mouth at bedtime. 04/14/19   Fulp, Cammie, MD  Multiple Vitamin (MULTIVITAMIN) tablet Take 1 tablet by  mouth daily.    [provider]  omeprazole (PRILOSEC) 20 MG capsule Take 1 capsule (20 mg total) by mouth daily. 04/14/19   Fulp, Cammie, MD  triamcinolone (KENALOG) 0.1 % Apply 1 application topically 2 (two) times daily. 09/07/20   [name], MD    Family History Family History  Problem Relation Age of Onset   Colon cancer Neg Hx    Colon polyps Neg Hx    Esophageal cancer Neg Hx    Rectal cancer Neg Hx    Stomach cancer Neg Hx     Social History Social History   Tobacco Use   Smoking status: Some Days   Smokeless  tobacco: Never   Tobacco comments:    0-2/day  Vaping Use   Vaping Use: Never used  Substance Use Topics   Alcohol use: No   Drug use: No     Allergies   Patient has no known allergies.   Review of Systems Review of Systems  Constitutional: Negative.   HENT: Negative.    Respiratory: Negative.    Cardiovascular: Negative.   Gastrointestinal:  Positive for abdominal pain, diarrhea, nausea and vomiting. Negative for abdominal distention, anal bleeding, blood in stool, constipation and rectal pain.  Skin: Negative.   Neurological: Negative.      Physical Exam Triage Vital Signs ED Triage Vitals  Enc Vitals Group     BP 08/19/22 1553 (!) 152/79     Pulse Rate 08/19/22 1553 89     Resp 08/19/22 1553 16     Temp 08/19/22 1553 98.1 F (36.7 C)     Temp Source 08/19/22 1553 Oral     SpO2 08/19/22 1553 94 %     Weight --      Height --      Head Circumference --      Peak Flow --      Pain Score 08/19/22 1554 2     Pain Loc --      Pain Edu? --      Excl. in GC? --    No data found.  Updated Vital Signs BP (!) 152/79 (BP Location: Right Arm)   Pulse 89   Temp 98.1 F (36.7 C) (Oral)   Resp 16   SpO2 94%   Visual Acuity Right Eye Distance:   Left Eye Distance:   Bilateral Distance:    Right Eye Near:   Left Eye Near:    Bilateral Near:     Physical Exam Constitutional:      Appearance: He is well-developed.  Pulmonary:     Effort: Pulmonary effort is normal.  Abdominal:     General: Abdomen is flat. Bowel sounds are normal.     Palpations: Abdomen is soft.     Tenderness: There is no abdominal tenderness.  Skin:    General: Skin is warm and dry.  Neurological:     General: No focal deficit present.     Mental Status: He is alert and oriented to person, place, and time.  Psychiatric:        Mood and Affect: Mood normal.        Behavior: Behavior normal.      UC Treatments / Results  Labs (all labs ordered are listed, but only abnormal  results are displayed) Labs Reviewed  CBG MONITORING, ED    EKG   Radiology No results found.  Procedures Procedures (including critical care time)  Medications Ordered in UC Medications - No data  to display  Initial Impression / Assessment and Plan / UC Course  I have reviewed the triage vital signs and the nursing notes.  Pertinent labs & imaging results that were available during my care of the patient were reviewed by me and considered in my medical decision making (see chart for details).  Gastroenteritis  Viral etiology versus bacterial food poisoning, discussed with patient, vitals are stable and he is in no signs of distress nor toxic appearing, no tenderness is noted to the abdominal exam and bowel sounds are normal, low suspicion for an acute abdomen at this time, point-of-care CBG 208, low suspicion of DKA as cause, patient was in ED earlier this morning but left without being seen, all lab work reviewed, stable, azithromycin and Zofran prescribed discussed administration, advised increase fluid intake until all symptoms have resolved, may follow-up as needed if symptoms persist or worsen Final Clinical Impressions(s) / UC Diagnoses   Final diagnoses:  None   Discharge Instructions   None    ED Prescriptions   None    PDMP not reviewed this encounter.   Valinda Hoar, NP 08/19/22 1640

## 2022-08-19 NOTE — Discharge Instructions (Addendum)
Lo ms probable es que sus sntomas sean causados por un virus o por una intoxicacin alimentaria; este saldr de su sistema en los [employer].  El nivel de azcar en la sangre en la oficina era 208 y no est tomando medicamentos ese da, baja sospecha de que esto est relacionado con su diabetes.  Todos los anlisis de sangre y pruebas de laboratorio realizados en el departamento de emergencias se ven bien, no hay preocupacin.  Tome azitromicina todas las maanas durante 3 das para cubrir las bacterias.  Puede usar zofran cada 8 horas segn sea necesario para las nuseas, tenga en cuenta que este medicamento puede causarle somnolencia, tome la primera dosis en casa para ver cmo afecta su cuerpo.  Puede usar ibuprofeno o Tylenol de venta libre, el que tenga en casa, para ayudar a Engineer, manufacturing.  Contine promoviendo la hidratacin durante todo Medical laboratory scientific officer utilizando una solucin de reemplazo de electrolitos como Gatorade, chalecos antibalas, Pedialyte, cualquiera que tenga en casa.  Intente comer alimentos suaves como pan, arroz, tostadas, frutas que sean ms fciles de digerir para [address], evite los alimentos demasiado picantes, demasiado condimentados o grasosos.    Your symptoms are most likely caused by a virus vs food poisoning, it will work its way out your system over the next few days  Blood sugar in office was 208 and he is not taking medicine the day, low suspicion that this is related to your diabetes  All blood work and lab testing completed in the emergency department looks good, no concern  Take azithromycin every morning for 3 days for coverage for bacteria  You can use zofran every 8 hours as needed for nausea, be mindful this medication may make you drowsy, take the first dose at home to see how it affects your body  You can use over-the-counter ibuprofen or Tylenol, which ever you have at home, to help manage fevers  Continue to promote hydration throughout  the day by using electrolyte replacement solution such as Gatorade, body armor, Pedialyte, which ever you have at home  Try eating bland foods such as bread, rice, toast, fruit which are easier on the stomach to digest, avoid foods that are overly spicy, overly seasoned or greasy

## 2022-08-19 NOTE — ED Triage Notes (Signed)
Diarrhea Thursday night. 3 pm yesterday began feeling a little dizzy and started vomiting. Has vomited 8-9 times since it began. Describes periumbilical/RLQ pain that started after vomiting. Believes he may have eaten something Thursday that could've upset his stomach. Describes the pain as a 2/10, like a fullness or pressure, has been belching more and describes feeling bloated. Last normal bowel movement as Thursday morning.   Went to ED today where they drew blood work and did a urine on him. Left d/t long wait times. Denies fever, sore throat, cough, CP, palpitations, SOB, wheezing, dysuria

## 2022-08-23 ENCOUNTER — Telehealth: Payer: Self-pay | Admitting: *Deleted

## 2022-08-23 NOTE — Telephone Encounter (Signed)
Patient states he is no longer a patient of Dr. Alvy Bimler. Has a new PCP

## 2022-09-25 ENCOUNTER — Encounter: Payer: Self-pay | Admitting: Physical Therapy

## 2022-09-25 ENCOUNTER — Ambulatory Visit: Payer: Medicare Other | Attending: Family Medicine | Admitting: Physical Therapy

## 2022-09-25 VITALS — BP 148/81 | HR 66

## 2022-09-25 DIAGNOSIS — R42 Dizziness and giddiness: Secondary | ICD-10-CM | POA: Diagnosis not present

## 2022-09-25 NOTE — Patient Instructions (Addendum)
Vrtigo posicional benigno Benign Positional Vertigo El vrtigo es la sensacin de que usted o todo lo que lo rodea se mueve cuando en realidad eso no sucede. El vrtigo posicional benigno es el tipo de vrtigo ms comn. Generalmente se trata de una enfermedad no daina (benigna). Esta afeccin es posicional. Esto significa que los sntomas son desencadenados por ciertos movimientos y posiciones. Esta afeccin puede ser peligrosa si ocurre mientras est haciendo algo que podra suponer un riesgo para usted o para los dems. Incluye actividades como conducir u operar maquinaria. Cules son las causas? El odo interno tiene canales llenos de lquido que ayudan a que el cerebro perciba el movimiento y el equilibrio. Cuando el lquido se mueve, el cerebro recibe mensajes sobre la posicin del cuerpo. En el vrtigo posicional benigno, los cristales de calcio que estn en el odo interno se desprenden y alteran la zona del odo interno. Esto hace que el cerebro reciba mensajes confusos sobre la posicin del cuerpo. Qu incrementa el riesgo? Es ms probable que sufra esta afeccin si: Es mujer. Es mayor de 50 aos. Ha sufrido una lesin en la cabeza recientemente. Tiene una enfermedad en el odo interno. Cules son los signos o sntomas? Generalmente, los sntomas de este trastorno se presentan al mover la cabeza o los ojos en diferentes direcciones. Los sntomas pueden aparecer repentinamente y suelen durar menos de un minuto. Incluyen los siguientes: Prdida del equilibrio y cadas. Sensacin de estar dando vueltas o movindose. Sensacin de que el entorno est dando vueltas o movindose. Nuseas y vmitos. Visin borrosa. Mareos. Movimientos oculares involuntarios (nistagmo). Los sntomas pueden ser leves y solo causar problemas menores, o pueden ser graves e interferir en la vida cotidiana. Los episodios de vrtigo posicional benigno pueden repetirse (volver a aparecer) con el transcurso del  tiempo. Los sntomas tambin pueden mejorar con el tiempo. Cmo se diagnostica? Esta afeccin se puede diagnosticar en funcin de lo siguiente: Sus antecedentes mdicos. Un examen fsico de la cabeza, el cuello y los odos. Pruebas de posicin para detectar o estimular el vrtigo. Es posible que le pidan que gire la cabeza y cambie de posicin, como pasar de estar sentado a acostarse. El mdico estar pendiente por si aparecen sntomas de vrtigo. Tal vez lo deriven a un mdico especialista en problemas de la garganta, la nariz y el odo (otorrinolaringlogo), o a uno que se especializa en trastornos del sistema nervioso (neurlogo). Cmo se trata?  Esta afeccin se puede tratar en una sesin en la cual el mdico le pondr la cabeza en posiciones especficas para ayudar a que los cristales desplazados del odo interno se muevan. El tratamiento de esta afeccin puede llevar varias sesiones. Cuando los casos son graves, tal vez haya que realizar una ciruga, pero esto no es frecuente. En algunos casos, el vrtigo posicional benigno se resuelve por s solo en el trmino de 2 o 4 semanas. Siga estas instrucciones en su casa: Seguridad Muvase lentamente. Evite algunas posiciones o determinados movimientos repentinos de la cabeza y el cuerpo como se lo haya indicado el mdico. Evite conducir y operar maquinaria hasta que el mdico le indique que es seguro hacerlo. No haga ninguna tarea que podra ser peligrosa para usted o para otras personas en caso de vrtigo. Si tiene dificultad para caminar o mantener el equilibrio, use un bastn para mantener la estabilidad. Si se siente mareado o inestable, sintese de inmediato. Retome sus actividades normales segn lo indicado por el mdico. Pregntele al mdico qu actividades son   seguras para usted. Instrucciones generales Use los medicamentos de venta libre y los recetados solamente como se lo haya indicado el mdico. Beba suficiente lquido como para  mantener la orina de color amarillo plido. Concurra a todas las visitas de seguimiento. Esto es importante. Comunquese con un mdico si: Tiene fiebre. Su afeccin empeora o presenta sntomas nuevos. Sus familiares o amigos advierten cambios en su comportamiento. Tiene nuseas o vmitos que empeoran. Tiene adormecimiento o sensacin de pinchazos y hormigueo. Busque ayuda de inmediato si: Tiene dificultad para hablar o para moverse. Siempre est mareado o se desmaya. Presenta dolores de cabeza intensos. Tiene debilidad en los brazos o las piernas. Presenta cambios en la audicin o la visin. Presenta rigidez en el cuello. Presenta sensibilidad a la luz. Estos sntomas pueden representar un problema grave que constituye una emergencia. No espere a ver si los sntomas desaparecen. Solicite atencin mdica de inmediato. Comunquese con el servicio de emergencias de su localidad (911 en los [place]). No conduzca por sus propios medios hasta el hospital. Resumen El vrtigo es la sensacin de que usted o todo lo que lo rodea se mueve cuando en realidad eso no sucede. El vrtigo posicional benigno es el tipo de vrtigo ms comn. La causa de esta afeccin es el desplazamiento de los cristales de calcio del odo interno. Esto produce una alteracin en una zona del odo interno que ayuda al cerebro a percibir el movimiento y el equilibrio. Los sntomas incluyen prdida del equilibrio y cadas, sensacin de que usted o su entorno se mueve, nuseas y vmitos, y visin borrosa. Esta afeccin se puede diagnosticar en funcin de los sntomas, un examen fsico y pruebas de posicionamiento. Siga las instrucciones de seguridad que le haya dado el mdico y vaya a todas las visitas de seguimiento. Esto es importante. Esta informacin no tiene como fin reemplazar el consejo del mdico. Asegrese de hacerle al mdico cualquier pregunta que tenga. Document Revised: 08/09/2020 Document Reviewed:  08/09/2020 Elsevier Patient Education  2023 [employer]  

## 2022-09-25 NOTE — Therapy (Signed)
OUTPATIENT PHYSICAL THERAPY VESTIBULAR EVALUATION     Patient Name: Patrick Park MRN: [id number] DOB:06/30/1951, 72 y.o., male Today's Date: 09/25/2022  END OF SESSION:  PT End of Session - 09/25/22 1050     Visit Number 1    Number of Visits 1    Authorization Type [employer]    Authorization Time Period 09-25-22 - 10-26-22    PT Start Time 1010    PT Stop Time 1050    PT Time Calculation (min) 40 min    Activity Tolerance Patient tolerated treatment well    Behavior During Therapy WFL for tasks assessed/performed             Past Medical History:  Diagnosis Date   Allergy    Benign paroxysmal positional vertigo    Diabetes mellitus without complication (HCC)    GERD (gastroesophageal reflux disease)    Hyperlipidemia    Hypertension    Past Surgical History:  Procedure Laterality Date   COLONOSCOPY     11-12 yrs ago- normal per pt-pt doesnt remeber where he had the colonoscopy    HERNIA REPAIR     Patient Active Problem List   Diagnosis Date Noted   Class 1 obesity due to excess calories with serious comorbidity and body mass index (BMI) of 32.0 to 32.9 in adult 09/23/2019   Allergic rhinitis 03/28/2019   Hypertension associated with diabetes ([place]) 10/03/2018   Dyslipidemia associated with type 2 diabetes mellitus ([place]) 10/03/2018   Dyslipidemia 10/03/2018   General weakness 10/03/2018   ED (erectile dysfunction) 01/22/2013   GERD (gastroesophageal reflux disease) 10/24/2012    PCP: Horald Pollen, MD  REFERRING PROVIDER: Loura Pardon, MD  REFERRING DIAG: [phone] (ICD-10-CM) - Other peripheral vertigo, unspecified ear  THERAPY DIAG:  Dizziness and giddiness  ONSET DATE: Referral date 09-21-22  Rationale for Evaluation and Treatment: Rehabilitation  SUBJECTIVE:   SUBJECTIVE STATEMENT: Pt reports this most recent episode of vertigo started approx. A month ago but pt reports he has not had any dizziness in past week ago;  Pt states  he thinks the dizziness is related to allergies/nasal problems Pt accompanied by: interpreter: Spanish  PERTINENT HISTORY: DM type 2:  h/o BPPV, HTN, GERD PAIN:  Are you having pain? No  PRECAUTIONS: None  WEIGHT BEARING RESTRICTIONS: No  FALLS: Has patient fallen in last 6 months? No  LIVING ENVIRONMENT: Lives with: lives with their spouse Lives in: House/apartment  PLOF: Independent  PATIENT GOALS: resolve the dizziness if possible  OBJECTIVE:   DIAGNOSTIC FINDINGS: N/A  COGNITION: Overall cognitive status: Within functional limits for tasks assessed  POSTURE:  No Significant postural limitations  Cervical ROM: WFL's    GAIT: Gait pattern: WFL Distance walked: 40' Assistive device utilized: None Level of assistance: Complete Independence  VESTIBULAR ASSESSMENT:  GENERAL OBSERVATION: pt is a 72 yr gentleman with c/o episodic dizziness; pt reports no dizziness at this time with exception of mild dizziness with supine to sitting   SYMPTOM BEHAVIOR:  Subjective history: pt reports this episode of dizziness started approx. 1 month ago but pt reports no dizziness at this time  Non-Vestibular symptoms: changes in vision  Type of dizziness:  "just dizziness"  Frequency: varies  Duration: seconds  Aggravating factors: No known aggravating factors  Relieving factors: slow movements  Progression of symptoms: better   POSITIONAL TESTING: Right Dix-Hallpike: no nystagmus Left Dix-Hallpike: no nystagmus Right Sidelying: no nystagmus Left Sidelying: no nystagmus  MOTION SENSITIVITY:  Motion Sensitivity Quotient Intensity: 0 =  none, 1 = Lightheaded, 2 = Mild, 3 = Moderate, 4 = Severe, 5 = Vomiting  Intensity  1. Sitting to supine 0  2. Supine to L side 0  3. Supine to R side 0  4. Supine to sitting 1  5. L Hallpike-Dix 0  6. Up from L  2  7. R Hallpike-Dix 0  8. Up from R  2  9. Sitting, head tipped to L knee   10. Head up from L knee   11. Sitting, head  tipped to R knee   12. Head up from R knee   13. Sitting head turns x5   14.Sitting head nods x5   15. In stance, 180 turn to L    16. In stance, 180 turn to R     PATIENT EDUCATION: Education details: article from [facility] on BPPV Person educated: Patient Education method: Explanation and Handouts Education comprehension: verbalized understanding  HOME EXERCISE PROGRAM:  N/A - eval only  ASSESSMENT:  CLINICAL IMPRESSION: Patient is a 72 y.o. gentleman who was seen today for physical therapy evaluation and treatment for dizziness.  Pt's report of symptoms are consistent with episodic BPPV which has resolved as of current time.  Pt reported mild dizziness with return to upright sitting from supine and from sidelying, lasting only approx. 3-5 seconds.  BP was checked and was slightly elevated, however, pt reported he had not taken BP medication this morning due to not having had time to eat prior to PT appt.  No nystagmus was noted with any positional testing and pt reported no room spinning vertigo with any positional testing.  Eval only as no BPPV present at this time.    OBJECTIVE IMPAIRMENTS: dizziness.   ACTIVITY LIMITATIONS:  N/A  PARTICIPATION LIMITATIONS:  N/A  PERSONAL FACTORS:  N/A  are also affecting patient's functional outcome.   REHAB POTENTIAL: Good  CLINICAL DECISION MAKING: Stable/uncomplicated  EVALUATION COMPLEXITY: Low   PLAN:  PT FREQUENCY: one time visit  PT DURATION: 1 week  PLANNED INTERVENTIONS: Patient/Family education and evaluation only as no BPPV present at this time - pt reports no dizziness with any positional testing  PLAN FOR NEXT SESSION: N/A - eval only   [name], [place], PT 09/25/2022, 7:45 PM

## 2022-11-15 ENCOUNTER — Ambulatory Visit: Payer: Medicare Other | Admitting: Gastroenterology

## 2023-01-09 ENCOUNTER — Ambulatory Visit: Payer: Medicare Other | Admitting: Internal Medicine

## 2023-02-01 ENCOUNTER — Other Ambulatory Visit: Payer: Self-pay | Admitting: Family Medicine

## 2023-02-01 ENCOUNTER — Ambulatory Visit
Admission: RE | Admit: 2023-02-01 | Discharge: 2023-02-01 | Disposition: A | Discharge status: Home / Self Care | Payer: Medicare Other | Source: Ambulatory Visit | Attending: Family Medicine | Admitting: Family Medicine

## 2023-02-01 DIAGNOSIS — R051 Acute cough: Secondary | ICD-10-CM

## 2023-09-11 ENCOUNTER — Other Ambulatory Visit: Payer: Self-pay | Admitting: Otolaryngology

## 2023-10-10 ENCOUNTER — Encounter (HOSPITAL_BASED_OUTPATIENT_CLINIC_OR_DEPARTMENT_OTHER): Admission: RE | Payer: Self-pay | Source: Home / Self Care

## 2023-10-10 ENCOUNTER — Ambulatory Visit (HOSPITAL_BASED_OUTPATIENT_CLINIC_OR_DEPARTMENT_OTHER): Admission: RE | Admit: 2023-10-10 | Payer: Medicare Other | Source: Home / Self Care | Admitting: Otolaryngology

## 2023-10-10 SURGERY — NASAL SEPTOPLASTY WITH TURBINATE REDUCTION
Anesthesia: General | Laterality: Bilateral

## 2024-01-15 ENCOUNTER — Other Ambulatory Visit: Payer: Self-pay | Admitting: Family Medicine

## 2024-01-15 DIAGNOSIS — Z122 Encounter for screening for malignant neoplasm of respiratory organs: Secondary | ICD-10-CM
